# Patient Record
Sex: Male | Born: 1970 | Race: Black or African American | Hispanic: No | Marital: Married | Smoking: Never smoker
Health system: Southern US, Community
[De-identification: ages and names within clinical notes are randomized; demographics above are authoritative.]

## PROBLEM LIST (undated history)

## (undated) DIAGNOSIS — I10 Essential (primary) hypertension: Secondary | ICD-10-CM

## (undated) DIAGNOSIS — F419 Anxiety disorder, unspecified: Secondary | ICD-10-CM

## (undated) DIAGNOSIS — U071 COVID-19: Secondary | ICD-10-CM

## (undated) DIAGNOSIS — T7840XA Allergy, unspecified, initial encounter: Secondary | ICD-10-CM

## (undated) DIAGNOSIS — E785 Hyperlipidemia, unspecified: Secondary | ICD-10-CM

## (undated) DIAGNOSIS — G43909 Migraine, unspecified, not intractable, without status migrainosus: Secondary | ICD-10-CM

## (undated) DIAGNOSIS — J45909 Unspecified asthma, uncomplicated: Secondary | ICD-10-CM

## (undated) DIAGNOSIS — K219 Gastro-esophageal reflux disease without esophagitis: Secondary | ICD-10-CM

## (undated) HISTORY — DX: COVID-19: U07.1

## (undated) HISTORY — DX: Gastro-esophageal reflux disease without esophagitis: K21.9

## (undated) HISTORY — DX: Migraine, unspecified, not intractable, without status migrainosus: G43.909

## (undated) HISTORY — PX: COLONOSCOPY: SHX174

## (undated) HISTORY — DX: Essential (primary) hypertension: I10

## (undated) HISTORY — DX: Anxiety disorder, unspecified: F41.9

## (undated) HISTORY — PX: TENDON REPAIR: SHX5111

## (undated) HISTORY — PX: COSMETIC SURGERY: SHX468

## (undated) HISTORY — DX: Allergy, unspecified, initial encounter: T78.40XA

## (undated) HISTORY — DX: Hyperlipidemia, unspecified: E78.5

---

## 2012-09-16 ENCOUNTER — Encounter (HOSPITAL_COMMUNITY): Payer: Self-pay | Admitting: *Deleted

## 2012-09-16 ENCOUNTER — Observation Stay (HOSPITAL_COMMUNITY)
Admission: EM | Admit: 2012-09-16 | Discharge: 2012-09-17 | DRG: 175 | Disposition: A | Discharge status: Home / Self Care | Payer: BC Managed Care – PPO | Attending: Internal Medicine | Admitting: Internal Medicine

## 2012-09-16 ENCOUNTER — Emergency Department (HOSPITAL_COMMUNITY): Payer: BC Managed Care – PPO

## 2012-09-16 ENCOUNTER — Encounter (HOSPITAL_COMMUNITY)
Admission: EM | Disposition: A | Discharge status: Home / Self Care | Payer: Self-pay | Source: Home / Self Care | Attending: Internal Medicine

## 2012-09-16 DIAGNOSIS — K922 Gastrointestinal hemorrhage, unspecified: Secondary | ICD-10-CM

## 2012-09-16 DIAGNOSIS — I959 Hypotension, unspecified: Secondary | ICD-10-CM | POA: Insufficient documentation

## 2012-09-16 DIAGNOSIS — K920 Hematemesis: Secondary | ICD-10-CM

## 2012-09-16 DIAGNOSIS — G43909 Migraine, unspecified, not intractable, without status migrainosus: Secondary | ICD-10-CM | POA: Insufficient documentation

## 2012-09-16 DIAGNOSIS — K2980 Duodenitis without bleeding: Secondary | ICD-10-CM | POA: Insufficient documentation

## 2012-09-16 DIAGNOSIS — K226 Gastro-esophageal laceration-hemorrhage syndrome: Principal | ICD-10-CM | POA: Insufficient documentation

## 2012-09-16 HISTORY — DX: Unspecified asthma, uncomplicated: J45.909

## 2012-09-16 HISTORY — PX: ESOPHAGOGASTRODUODENOSCOPY: SHX5428

## 2012-09-16 LAB — BASIC METABOLIC PANEL
BUN: 16 mg/dL (ref 6–23)
Calcium: 8.3 mg/dL — ABNORMAL LOW (ref 8.4–10.5)
Creatinine, Ser: 1.01 mg/dL (ref 0.50–1.35)
GFR calc Af Amer: >90 mL/min (ref >90)
GFR calc non Af Amer: >90 mL/min (ref >90)
Glucose, Bld: 108 mg/dL — ABNORMAL HIGH (ref 70–99)
Potassium: 3.9 mEq/L (ref 3.5–5.1)

## 2012-09-16 LAB — CBC
HCT: 37.9 % — ABNORMAL LOW (ref 39.0–52.0)
MCH: 27.1 pg (ref 26.0–34.0)
MCV: 84.2 fL (ref 78.0–100.0)
RDW: 14.6 % (ref 11.5–15.5)
WBC: 7.5 10*3/uL (ref 4.0–10.5)

## 2012-09-16 LAB — CBC WITH DIFFERENTIAL/PLATELET
Basophils Relative: 0 % (ref 0–1)
Eosinophils Absolute: 0 10*3/uL (ref 0.0–0.7)
Eosinophils Absolute: 0 10*3/uL (ref 0.0–0.7)
Eosinophils Relative: 0 % (ref 0–5)
Eosinophils Relative: 1 % (ref 0–5)
Hemoglobin: 11.6 g/dL — ABNORMAL LOW (ref 13.0–17.0)
Lymphs Abs: 1.5 10*3/uL (ref 0.7–4.0)
Lymphs Abs: 1.4 10*3/uL (ref 0.7–4.0)
MCH: 27.3 pg (ref 26.0–34.0)
MCH: 26.9 pg (ref 26.0–34.0)
MCHC: 32.2 g/dL (ref 30.0–36.0)
MCV: 83.1 fL (ref 78.0–100.0)
MCV: 83.5 fL (ref 78.0–100.0)
Monocytes Relative: 7 % (ref 3–12)
Monocytes Relative: 7 % (ref 3–12)
Neutrophils Relative %: 69 % (ref 43–77)
Platelets: 272 10*3/uL (ref 150–400)
RBC: 5.02 MIL/uL (ref 4.22–5.81)
RBC: 4.31 MIL/uL (ref 4.22–5.81)

## 2012-09-16 LAB — COMPREHENSIVE METABOLIC PANEL
BUN: 19 mg/dL (ref 6–23)
Calcium: 9.2 mg/dL (ref 8.4–10.5)
Creatinine, Ser: 1.22 mg/dL (ref 0.50–1.35)
GFR calc Af Amer: 85 mL/min — ABNORMAL LOW (ref >90)
Glucose, Bld: 125 mg/dL — ABNORMAL HIGH (ref 70–99)
Sodium: 134 mEq/L — ABNORMAL LOW (ref 135–145)
Total Protein: 7.4 g/dL (ref 6.0–8.3)

## 2012-09-16 LAB — SAMPLE TO BLOOD BANK

## 2012-09-16 LAB — LACTIC ACID, PLASMA: Lactic Acid, Venous: 2.2 mmol/L (ref 0.5–2.2)

## 2012-09-16 LAB — PROTIME-INR
INR: 1.09 (ref 0.00–1.49)
Prothrombin Time: 14 seconds (ref 11.6–15.2)

## 2012-09-16 SURGERY — EGD (ESOPHAGOGASTRODUODENOSCOPY)
Anesthesia: Moderate Sedation

## 2012-09-16 MED ORDER — PANTOPRAZOLE SODIUM 40 MG IV SOLR
8.0000 mg/h | INTRAVENOUS | Status: DC
  Administered 2012-09-16 (×3): 8 mg/h via INTRAVENOUS
  Filled 2012-09-16 (×9): qty 80

## 2012-09-16 MED ORDER — ALBUTEROL SULFATE (5 MG/ML) 0.5% IN NEBU: 2.5000 mg | INHALATION_SOLUTION | RESPIRATORY_TRACT | Status: DC | PRN

## 2012-09-16 MED ORDER — SODIUM CHLORIDE 0.9 % IV BOLUS (SEPSIS)
2000.0000 mL | Freq: Once | INTRAVENOUS | Status: AC
  Administered 2012-09-16: 2000 mL via INTRAVENOUS

## 2012-09-16 MED ORDER — MIDAZOLAM HCL 5 MG/ML IJ SOLN
INTRAMUSCULAR | Status: AC
  Filled 2012-09-16: qty 2

## 2012-09-16 MED ORDER — BUTAMBEN-TETRACAINE-BENZOCAINE 2-2-14 % EX AERO
INHALATION_SPRAY | CUTANEOUS | Status: DC | PRN
  Administered 2012-09-16: 2 via TOPICAL

## 2012-09-16 MED ORDER — ONDANSETRON HCL 4 MG PO TABS: 4.0000 mg | ORAL_TABLET | Freq: Four times a day (QID) | ORAL | Status: DC | PRN

## 2012-09-16 MED ORDER — TOPIRAMATE 25 MG PO TABS
50.0000 mg | ORAL_TABLET | Freq: Every day | ORAL | Status: DC | PRN
  Filled 2012-09-16: qty 2

## 2012-09-16 MED ORDER — ONDANSETRON HCL 4 MG/2ML IJ SOLN
4.0000 mg | Freq: Four times a day (QID) | INTRAMUSCULAR | Status: DC
  Administered 2012-09-16 – 2012-09-17 (×6): 4 mg via INTRAVENOUS
  Filled 2012-09-16 (×6): qty 2

## 2012-09-16 MED ORDER — SODIUM CHLORIDE 0.9 % IV SOLN
80.0000 mg | Freq: Once | INTRAVENOUS | Status: AC
  Administered 2012-09-16: 80 mg via INTRAVENOUS
  Filled 2012-09-16: qty 80

## 2012-09-16 MED ORDER — GUAIFENESIN-DM 100-10 MG/5ML PO SYRP: 5.0000 mL | ORAL_SOLUTION | ORAL | Status: DC | PRN

## 2012-09-16 MED ORDER — SODIUM CHLORIDE 0.9 % IV SOLN
1000.0000 mL | Freq: Once | INTRAVENOUS | Status: AC
  Administered 2012-09-16: 1000 mL via INTRAVENOUS

## 2012-09-16 MED ORDER — HYDROCODONE-ACETAMINOPHEN 5-325 MG PO TABS: 1.0000 | ORAL_TABLET | ORAL | Status: DC | PRN

## 2012-09-16 MED ORDER — SUMATRIPTAN SUCCINATE 100 MG PO TABS
100.0000 mg | ORAL_TABLET | ORAL | Status: DC | PRN
  Filled 2012-09-16: qty 1

## 2012-09-16 MED ORDER — FENTANYL CITRATE 0.05 MG/ML IJ SOLN
INTRAMUSCULAR | Status: DC | PRN
  Administered 2012-09-16 (×3): 25 ug via INTRAVENOUS

## 2012-09-16 MED ORDER — MIDAZOLAM HCL 10 MG/2ML IJ SOLN
INTRAMUSCULAR | Status: DC | PRN
  Administered 2012-09-16: 2 mg via INTRAVENOUS
  Administered 2012-09-16: 1 mg via INTRAVENOUS
  Administered 2012-09-16: 2 mg via INTRAVENOUS

## 2012-09-16 MED ORDER — ACETAMINOPHEN 650 MG RE SUPP: 650.0000 mg | Freq: Four times a day (QID) | RECTAL | Status: DC | PRN

## 2012-09-16 MED ORDER — SODIUM CHLORIDE 0.9 % IV SOLN
1000.0000 mL | INTRAVENOUS | Status: DC
  Administered 2012-09-16: 1000 mL via INTRAVENOUS

## 2012-09-16 MED ORDER — MOMETASONE FURO-FORMOTEROL FUM 100-5 MCG/ACT IN AERO
2.0000 | INHALATION_SPRAY | Freq: Two times a day (BID) | RESPIRATORY_TRACT | Status: DC
  Administered 2012-09-16 – 2012-09-17 (×3): 2 via RESPIRATORY_TRACT
  Filled 2012-09-16: qty 8.8

## 2012-09-16 MED ORDER — ACETAMINOPHEN 325 MG PO TABS: 650.0000 mg | ORAL_TABLET | Freq: Four times a day (QID) | ORAL | Status: DC | PRN

## 2012-09-16 MED ORDER — ONDANSETRON HCL 4 MG/2ML IJ SOLN: 4.0000 mg | Freq: Four times a day (QID) | INTRAMUSCULAR | Status: DC | PRN

## 2012-09-16 MED ORDER — SODIUM CHLORIDE 0.9 % IV SOLN: INTRAVENOUS | Status: AC

## 2012-09-16 MED ORDER — SODIUM CHLORIDE 0.9 % IJ SOLN
3.0000 mL | Freq: Two times a day (BID) | INTRAMUSCULAR | Status: DC
  Administered 2012-09-16: 3 mL via INTRAVENOUS

## 2012-09-16 MED ORDER — DIPHENHYDRAMINE HCL 50 MG/ML IJ SOLN
INTRAMUSCULAR | Status: AC
  Filled 2012-09-16: qty 1

## 2012-09-16 MED ORDER — ALBUTEROL SULFATE HFA 108 (90 BASE) MCG/ACT IN AERS
1.0000 | INHALATION_SPRAY | Freq: Two times a day (BID) | RESPIRATORY_TRACT | Status: DC
  Administered 2012-09-16 – 2012-09-17 (×4): 1 via RESPIRATORY_TRACT
  Filled 2012-09-16: qty 6.7

## 2012-09-16 MED ORDER — FENTANYL CITRATE 0.05 MG/ML IJ SOLN
INTRAMUSCULAR | Status: AC
  Filled 2012-09-16: qty 2

## 2012-09-16 MED ORDER — SODIUM CHLORIDE 0.9 % IV SOLN
50.0000 ug/h | INTRAVENOUS | Status: DC
  Administered 2012-09-16: 50 ug/h via INTRAVENOUS
  Filled 2012-09-16 (×3): qty 1

## 2012-09-16 NOTE — ED Notes (Signed)
Pt coming from home with c/o hematemesis, hypotention, tachycardia. Wife states blood was bright red blood. Pt reports uncontrollable diarrhea. Upon arrival pt was warm cool. En route pt became diaphoretic and cool. Pt given 250 mL of NS. 18 g LAC.

## 2012-09-16 NOTE — Progress Notes (Signed)
Pt chart reviewed and pt is doing well.

## 2012-09-16 NOTE — Consult Note (Signed)
Referring Provider: [facility] Primary Care Physician:  No primary provider on file. Primary Gastroenterologist:  None, unassigned for Drs. Mann/Hung  Reason for Consultation:  Hematemesis  HPI: Calvin Clay is a 41 y.o. male generally in good health with history of migraine headaches and asthma. Patient came to the emergency room last evening and was admitted. He had acute onset yesterday morning about 6 AM with upper abdominal pain which did not radiate and nausea. He said within a couple of hours he had a violent episode of vomiting vomited up a lot of material in it then saw streaks of bright red blood. He says rest of the day he did not feel well was tired fatigued and stayed in bed. He continued to have upper abdominal discomfort, he was able to drink water and had some bread. Last evening about 10:30 after he had gone to bed he became acutely nauseated again and had another episode of violent vomiting and says that time he vomited up a lot of bright red blood and dark blood. His wife took a picture, and showed this to me and indeed it looks like a commode full blood. With that episode he had diaphoresis and dizziness and an episode of watery diarrhea which was nonbloody. His wife called EMS and he was transported to the hospital, initially had a blood pressure the 80s to 90s systolic which improved with volume replacement. Since admission he has not had any further vomiting, hematemesis or diarrhea. He feels better this morning though still complaining of upper abdominal discomfort. His blood pressure is stabilized in the low 100s and his pulse is normal. He has no prior GI history him a does have periodic heartburn. He admits to taking over-the-counter migraine medication which contains aspirin and caffeine and says that she usually takes a few doses 2 or 3 times per week He has no history of ETOH abuse. On further questioning, patient has a 12-year-old son who was  sick earlier in the week  with nausea and diarrhea which resolved within 24 hours.   Past Medical History  Diagnosis Date  . Asthma     Past Surgical History  Procedure Date  . Cosmetic surgery     Prior to Admission medications   Medication Sig Start Date End Date Taking? Authorizing Provider  albuterol (PROVENTIL HFA;VENTOLIN HFA) 108 (90 BASE) MCG/ACT inhaler Inhale 1 puff into the lungs 2 (two) times daily.   Yes Historical Provider, MD  aspirin-acetaminophen-caffeine (EXCEDRIN MIGRAINE) [phone] MG per tablet Take 1 tablet by mouth every 6 (six) hours as needed. For headache   Yes Historical Provider, MD  calcium carbonate (TUMS - DOSED IN MG ELEMENTAL CALCIUM) 500 MG chewable tablet Chew 1 tablet by mouth daily.   Yes Historical Provider, MD  mometasone-formoterol (DULERA) 100-5 MCG/ACT AERO Inhale 2 puffs into the lungs 2 (two) times daily.   Yes Historical Provider, MD  SUMAtriptan (IMITREX) 100 MG tablet Take 100 mg by mouth every 2 (two) hours as needed. For headache   Yes Historical Provider, MD  topiramate (TOPAMAX) 50 MG tablet Take 50 mg by mouth daily as needed. For headaches   Yes Historical Provider, MD    Current Facility-Administered Medications  Medication Dose Route Frequency Provider Last Rate Last Dose  . 0.9 %  sodium chloride infusion  1,000 mL Intravenous Once Olivia Mackie, MD   1,000 mL at 09/16/12 0050   Followed by  . 0.9 %  sodium chloride infusion  1,000 mL Intravenous Once  Olivia Mackie, MD   1,000 mL at 09/16/12 0050  . 0.9 %  sodium chloride infusion   Intravenous STAT Olivia Mackie, MD 150 mL/hr at 09/16/12 1000    . acetaminophen (TYLENOL) tablet 650 mg  650 mg Oral Q6H PRN Therisa Doyne, MD       Or  . acetaminophen (TYLENOL) suppository 650 mg  650 mg Rectal Q6H PRN Therisa Doyne, MD      . albuterol (PROVENTIL HFA;VENTOLIN HFA) 108 (90 BASE) MCG/ACT inhaler 1 puff  1 puff Inhalation BID Anastassia Doutova, MD      . albuterol (PROVENTIL) (5 MG/ML) 0.5%  nebulizer solution 2.5 mg  2.5 mg Nebulization Q2H PRN Therisa Doyne, MD      . guaiFENesin-dextromethorphan (ROBITUSSIN DM) 100-10 MG/5ML syrup 5 mL  5 mL Oral Q4H PRN Therisa Doyne, MD      . HYDROcodone-acetaminophen (NORCO/VICODIN) 5-325 MG per tablet 1-2 tablet  1-2 tablet Oral Q4H PRN Therisa Doyne, MD      . mometasone-formoterol (DULERA) 100-5 MCG/ACT inhaler 2 puff  2 puff Inhalation BID Therisa Doyne, MD      . ondansetron (ZOFRAN) injection 4 mg  4 mg Intravenous Q6H Olivia Mackie, MD   4 mg at 09/16/12 0101  . ondansetron (ZOFRAN) tablet 4 mg  4 mg Oral Q6H PRN Therisa Doyne, MD       Or  . ondansetron (ZOFRAN) injection 4 mg  4 mg Intravenous Q6H PRN Therisa Doyne, MD      . pantoprazole (PROTONIX) 80 mg in sodium chloride 0.9 % 100 mL IVPB  80 mg Intravenous Once Olivia Mackie, MD   80 mg at 09/16/12 0147  . pantoprazole (PROTONIX) 80 mg in sodium chloride 0.9 % 250 mL infusion  8 mg/hr Intravenous Continuous Olivia Mackie, MD 25 mL/hr at 09/16/12 0213 8 mg/hr at 09/16/12 0213  . sodium chloride 0.9 % bolus 2,000 mL  2,000 mL Intravenous Once Olivia Mackie, MD   2,000 mL at 09/16/12 0523  . sodium chloride 0.9 % injection 3 mL  3 mL Intravenous Q12H Therisa Doyne, MD      . SUMAtriptan (IMITREX) tablet 100 mg  100 mg Oral Q2H PRN Therisa Doyne, MD      . topiramate (TOPAMAX) tablet 50 mg  50 mg Oral Daily PRN Therisa Doyne, MD      . DISCONTD: 0.9 %  sodium chloride infusion  1,000 mL Intravenous Continuous Olivia Mackie, MD   1,000 mL at 09/16/12 0153  . DISCONTD: octreotide (SANDOSTATIN) 2 mcg/mL in sodium chloride 0.9 % 250 mL infusion  50 mcg/hr Intravenous Continuous Olivia Mackie, MD   50 mcg/hr at 09/16/12 0151    Allergies as of 09/16/2012 - Review Complete 09/16/2012  Allergen Reaction Noted  . Iodine Anaphylaxis 09/16/2012    Family History  Problem Relation Age of Onset  . Colon cancer Mother   . Colon cancer Father      History   Social History  . Marital Status: Married    Spouse Name: N/A    Number of Children: N/A  . Years of Education: N/A   Occupational History  . Not on file.   Social History Main Topics  . Smoking status: Former Games developer  . Smokeless tobacco: Not on file  . Alcohol Use: Yes     occasional  . Drug Use: No  . Sexually Active:    Other Topics Concern  . Not on file  Social History Narrative  . No narrative on file    Review of Systems: Pertinent positive and negative review of systems were noted in the above HPI section.  All other review of systems was otherwise negative.  Physical Exam: Vital signs in last 24 hours: Temp:  [97.9 F (36.6 C)-99 F (37.2 C)] 98.1 F (36.7 C) (10/12 0905) Pulse Rate:  [79-102] 88  (10/12 1000) Resp:  [6-20] 19  (10/12 1000) BP: (83-119)/(50-73) 113/73 mmHg (10/12 1000) SpO2:  [94 %-100 %] 99 % (10/12 1000) Weight:  [312 lb 9.8 oz (141.8 kg)] 312 lb 9.8 oz (141.8 kg) (10/12 1610) Last BM Date: 09/14/12 General:   Alert, well-developed, well-nourished, AA male, pleasant and cooperative in NAD Head:  Normocephalic and atraumatic. Eyes:  Sclera clear, no icterus.   Conjunctiva pink. Ears:  Normal auditory acuity. Nose:  No deformity, discharge,  or lesions. Mouth:  No deformity or lesions.   Neck:  Supple; no masses or thyromegaly. Lungs:  Clear throughout to auscultation.   No wheezes, crackles, or rhonchi.  Heart:  Regular rate and rhythm; no murmurs, clicks, rubs,  or gallops. Abdomen:  Soft,tenderupper abdomen, no guarding, no rebound, no mass or hsm ,bs+   Rectal:  Deferred  Msk:  Symmetrical without gross deformities. . Pulses:  Normal pulses noted. Extremities:  Without clubbing or edema. Neurologic:  Alert and  oriented x4;  grossly normal neurologically. Skin:  Intact without significant lesions or rashes. Psych:  Alert and cooperative. Normal mood and affect.  Intake/Output from previous day: 10/11 0701 -  10/12 0700 In: 25 [I.V.:25] Out: 500 [Urine:500] Intake/Output this shift: Total I/O In: 75 [I.V.:75] Out: 0   Lab Results:  [facility] 09/16/12 0042  WBC 12.5*  HGB 13.7  HCT 41.7  PLT 272   BMET  Basename 09/16/12 0042  NA 134*  K 3.8  CL 100  CO2 21  GLUCOSE 125*  BUN 19  CREATININE 1.22  CALCIUM 9.2   LFT  Basename 09/16/12 0042  PROT 7.4  ALBUMIN 3.3*  AST 29  ALT 37  ALKPHOS 82  BILITOT 0.6  BILIDIR --  IBILI --   PT/INR  Basename 09/16/12 0042  LABPROT 14.0  INR 1.09   Studies/Results: Dg Abd Acute W/chest  09/16/2012  *RADIOLOGY REPORT*  Clinical Data: Nausea/vomiting/diarrhea, hypertension, hematemesis  ACUTE ABDOMEN SERIES (ABDOMEN 2 VIEW & CHEST 1 VIEW)  Comparison: None.  Findings: Lungs are clear. No pleural effusion or pneumothorax.  Cardiomediastinal silhouette is within normal limits.  Nonobstructive bowel gas pattern.  No evidence of free air under the diaphragm on the upright view.  Visualized osseous structures are within normal limits.  9 mm calcification overlying the right lower pelvis.  IMPRESSION: No evidence of acute cardiopulmonary disease.  No evidence of small bowel obstruction or free air.  9 mm calcification overlying the right lower pelvis, nonspecific, ureteral/bladder calculus not excluded.   Original Report Authenticated By: Charline Bills, M.D.     IMPRESSION:  #67 41 year old male with acute hematemesis of bright red blood occurring after episodes of forceful vomiting. Rule out a Mallory-Weiss tear versus ulcer disease. #2 upper abdominal pain nausea and diarrhea, acute onset within the past 24 hours-suspect this may be a gastroenteritis complicated by above #3 hypotension-resolved #4 history of migraines  PLAN: #1 will schedule for upper endoscopy this morning with Dr. Russella Dar, procedure was discussed in detail with the patient and his wife and they are agreeable to proceed #2 keep n.p.o. until after  endoscopy #3 stat CBC  and BMET #4 serial hemoglobins #5 IV Protonix every 12 hours, and anti-emetics as needed #6 further plans pending results of the EGD  Amy Esterwood  09/16/2012, 10:20 AM   I have taken a history, examined the patient and reviewed the chart. I agree with the extender's note, impression and recommendations. Hematemesis following repeated vomiting. Suspect a Mallory Weiss tear. R/O ulcer and other cause. Suspect a gastroenteritis lead to N/V. EGD today.  Meryl Dare MD [facility]

## 2012-09-16 NOTE — H&P (Signed)
PCP:  [facility]   Chief Complaint:  hematemesis  HPI: Calvin Clay is a 41 y.o. male   has a past medical history of Asthma.   Presented with  24 hours of Nausea vomiting diarrhea an severe abdominal pain. His first vomited  just food. At 11 pm he had a bloody emesis. Denies any fevers or chills he overall feels very lethargic and tired. He reports severe prolonged periods of emesis and straining. He had been having periumbilical pain. No sick contacts at home.  Review of Systems:    Pertinent positives include:abdominal pain, nausea, vomiting, diarrhea,  hematemesis  Constitutional:  No weight loss, night sweats, Fevers, chills, fatigue, weight loss  HEENT:  No headaches, Difficulty swallowing,Tooth/dental problems,Sore throat,  No sneezing, itching, ear ache, nasal congestion, post nasal drip,  Cardio-vascular:  No chest pain, Orthopnea, PND, anasarca, dizziness, palpitations.no Bilateral lower extremity swelling  GI:  No heartburn, indigestion, change in bowel habits, loss of appetite, melena, blood in stool, Resp:  no shortness of breath at rest. No dyspnea on exertion, No excess mucus, no productive cough, No non-productive cough, No coughing up of blood.No change in color of mucus.No wheezing. Skin:  no rash or lesions. No jaundice GU:  no dysuria, change in color of urine, no urgency or frequency. No straining to urinate.  No flank pain.  Musculoskeletal:  No joint pain or no joint swelling. No decreased range of motion. No back pain.  Psych:  No change in mood or affect. No depression or anxiety. No memory loss.  Neuro: no localizing neurological complaints, no tingling, no weakness, no double vision, no gait abnormality, no slurred speech, no confusion  Otherwise ROS are negative except for above, 10 systems were reviewed  Past Medical History: Past Medical History  Diagnosis Date  . Asthma    Past Surgical History  Procedure Date  .  Cosmetic surgery      Medications: Prior to Admission medications   Medication Sig Start Date End Date Taking? Authorizing Provider  albuterol (PROVENTIL HFA;VENTOLIN HFA) 108 (90 BASE) MCG/ACT inhaler Inhale 1 puff into the lungs 2 (two) times daily.   Yes Historical Provider, MD  aspirin-acetaminophen-caffeine (EXCEDRIN MIGRAINE) [phone] MG per tablet Take 1 tablet by mouth every 6 (six) hours as needed. For headache   Yes Historical Provider, MD  calcium carbonate (TUMS - DOSED IN MG ELEMENTAL CALCIUM) 500 MG chewable tablet Chew 1 tablet by mouth daily.   Yes Historical Provider, MD  mometasone-formoterol (DULERA) 100-5 MCG/ACT AERO Inhale 2 puffs into the lungs 2 (two) times daily.   Yes Historical Provider, MD  SUMAtriptan (IMITREX) 100 MG tablet Take 100 mg by mouth every 2 (two) hours as needed. For headache   Yes Historical Provider, MD  topiramate (TOPAMAX) 50 MG tablet Take 50 mg by mouth daily as needed. For headaches   Yes Historical Provider, MD    Allergies:   Allergies  Allergen Reactions  . Iodine Anaphylaxis    Social History:  Ambulatory   Independently  Lives at  home   reports that he has quit smoking. He does not have any smokeless tobacco history on file. He reports that he drinks alcohol. He reports that he does not use illicit drugs.   Family History: family history includes Colon cancer in his father and mother.    Physical Exam: Patient Vitals for the past 24 hrs:  BP Temp Temp src Pulse Resp SpO2  09/16/12 0430 104/61 mmHg - - 88  12  94 %  09/16/12 0330 108/63 mmHg - - 88  11  94 %  09/16/12 0315 105/73 mmHg - - 90  12  100 %  09/16/12 0300 104/68 mmHg - - 87  14  94 %  09/16/12 0245 105/73 mmHg - - 87  - 99 %  09/16/12 0200 100/57 mmHg - - 92  16  95 %  09/16/12 0130 108/50 mmHg - - 98  14  99 %  09/16/12 0115 109/50 mmHg - - 100  20  98 %  09/16/12 0100 94/50 mmHg - - 98  18  97 %  09/16/12 0034 83/66 mmHg 98.1 F (36.7 C) Oral 102  16   96 %    1. General:  in No Acute distress 2. Psychological: Alert and Oriented 3. Head/ENT:    Dry Mucous Membranes                          Head Non traumatic, neck supple                          Normal  Dentition 4. SKIN:  decreased Skin turgor,  Skin clean Dry and intact no rash 5. Heart: Regular rate and rhythm no Murmur, Rub or gallop 6. Lungs: Clear to auscultation bilaterally, no wheezes or crackles   7. Abdomen: Soft, slightly diffusely tender, Non distended 8. Lower extremities: no clubbing, cyanosis, or edema 9. Neurologically Grossly intact, moving all 4 extremities equally 10. MSK: Normal range of motion  body mass index is unknown because there is no height or weight on file.   Labs on Admission:   [facility] 09/16/12 0042  NA 134*  K 3.8  CL 100  CO2 21  GLUCOSE 125*  BUN 19  CREATININE 1.22  CALCIUM 9.2  MG --  PHOS --    Basename 09/16/12 0042  AST 29  ALT 37  ALKPHOS 82  BILITOT 0.6  PROT 7.4  ALBUMIN 3.3*   No results found for this basename: LIPASE:2,AMYLASE:2 in the last 72 hours  Basename 09/16/12 0042  WBC 12.5*  NEUTROABS 10.1*  HGB 13.7  HCT 41.7  MCV 83.1  PLT 272   No results found for this basename: CKTOTAL:3,CKMB:3,CKMBINDEX:3,TROPONINI:3 in the last 72 hours No results found for this basename: TSH,T4TOTAL,FREET3,T3FREE,THYROIDAB in the last 72 hours No results found for this basename: VITAMINB12:2,FOLATE:2,FERRITIN:2,TIBC:2,IRON:2,RETICCTPCT:2 in the last 72 hours No results found for this basename: HGBA1C    CrCl is unknown because there is no height on file for the current visit. ABG No results found for this basename: phart, pco2, po2, hco3, tco2, acidbasedef, o2sat     No results found for this basename: DDIMER         Radiological Exams on Admission: Dg Abd Acute W/chest  09/16/2012  *RADIOLOGY REPORT*  Clinical Data: Nausea/vomiting/diarrhea, hypertension, hematemesis  ACUTE ABDOMEN SERIES (ABDOMEN 2 VIEW & CHEST  1 VIEW)  Comparison: None.  Findings: Lungs are clear. No pleural effusion or pneumothorax.  Cardiomediastinal silhouette is within normal limits.  Nonobstructive bowel gas pattern.  No evidence of free air under the diaphragm on the upright view.  Visualized osseous structures are within normal limits.  9 mm calcification overlying the right lower pelvis.  IMPRESSION: No evidence of acute cardiopulmonary disease.  No evidence of small bowel obstruction or free air.  9 mm calcification overlying the right lower pelvis, nonspecific, ureteral/bladder calculus not  excluded.   Original Report Authenticated By: Charline Bills, M.D.     Chart has been reviewed  Assessment/Plan  53 y o with hematemesis and possible gastroenteritis and dehydration  Present on Admission:  .GI bleed - GI is aware and will come and see him in a.m. meanwhile we'll continue on Protonix drip and aggressive IV fluid rehydration we'll admit to step down repeat serial CBCs. Make n.p.o.  .Hematemesis - I suspect Mallory-Weiss tear will need endoscopy to confirm or make sure he is on Protonix  .Hypotension - responding to IV fluids will continue and will watch and step down  .Migraine - continue home medications   Prophylaxis: SCD Protonix  CODE STATUS: FULL CODE   Other plan as per orders.  I have spent a total of 55 min on this admission  Calvin Clay 09/16/2012, 5:05 AM

## 2012-09-16 NOTE — Op Note (Signed)
[facility] [address] [place], [address]   ENDOSCOPY PROCEDURE REPORT  PATIENT: Calvin Clay, Calvin Clay  MR#: [id number] BIRTHDATE: 17-Oct-1971 , 41  yrs. old GENDER: Male ENDOSCOPIST: Meryl Dare, MD, [facility] REFERRED BY:  [facility] PROCEDURE DATE:  09/16/2012 PROCEDURE:  EGD, diagnostic ASA CLASS:     Class II INDICATIONS:  hematemesis,  vomiting, epigastric pain MEDICATIONS: medications were titrated to patient response per physician's verbal order, Versed 5 mg IV, and Fentanyl 75 mcg IV TOPICAL ANESTHETIC: Cetacaine Spray DESCRIPTION OF PROCEDURE: After the risks benefits and alternatives of the procedure were thoroughly explained, informed consent was obtained.  The Pentax Gastroscope [id number] endoscope was introduced through the mouth and advanced to the second portion of the duodenum. Limited by retained old blood primarily in the fundus but scattered throughout the stomach.  The instrument was slowly withdrawn as the mucosa was fully examined.   ESOPHAGUS: A Mallory-Weiss tear with evidence of recent bleeding was found at the gastroesophageal junction. A small clot on the tear. No active bleeding noted.  The esophagus was otherwise normal. STOMACH: The mucosa of the stomach appeared normal however the fundus was not fully visualized with retained old blood. DUODENUM: Mild duodenal inflammation was found in the duodenal bulb. The duodenal mucosa showed no abnormalities in the 2nd part of the duodenum. Retroflexed views revealed no abnormalities.  The scope was then withdrawn from the patient and the procedure completed.  COMPLICATIONS: There were no complications.  ENDOSCOPIC IMPRESSION: 1.   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   Duodenitis, erosive, mild  RECOMMENDATIONS: 1. Continue PPI for 6 weeks 2. Serum H. pylori Ab 3. Clear liquids and advance diet as tolerated    eSigned:  Meryl Dare, MD, [facility]  09/16/2012 12:57 PM

## 2012-09-16 NOTE — ED Provider Notes (Signed)
History     CSN: [id number]  Arrival date & time 09/16/12  0028   First MD Initiated Contact with Patient 09/16/12 0030      Chief Complaint  Patient presents with  . Hematemesis  . Hypotension  . Abdominal Pain    (Consider location/radiation/quality/duration/timing/severity/associated sxs/prior treatment) HPI 41 yo male presents from home with reported vomiting blood, diarrhea, and low blood pressure.  Pt reports waking yesterday morning (friday) around 6 am with abdominal pain and nausea.  He had a "violent, severe" episode of emesis with mainly food from prior night's dinner mixed with a few streaks of blood.  Pt laid in bed most of the day with abdominal cramping and malaise.  Pt tolerated water during the day.  In the afternoon he ate a few pieces of bread.  This evening he had another episode of vomiting, this time bright red blood followed by large amount of watery stools.  No blood or black appearance to stools.  He denies NSAID use, no heavy alcohol intake, no h/o liver, stomach or esophagus problems aside from GERD tx with nexium in the past.  No travel, no sick contacts.  No prior h/o same.  EMS reports pt cool,diaphoretic clammy and hypotensive in route, bps 80-90 systolic.  Past Medical History  Diagnosis Date  . Asthma     Past Surgical History  Procedure Date  . Cosmetic surgery     Family History  Problem Relation Age of Onset  . Colon cancer Mother   . Colon cancer Father     History  Substance Use Topics  . Smoking status: Former Games developer  . Smokeless tobacco: Not on file  . Alcohol Use: Yes     occasional      Review of Systems  All other systems reviewed and are negative.    Allergies  Iodine  Home Medications   Current Outpatient Rx  Name Route Sig Dispense Refill  . ALBUTEROL SULFATE HFA 108 (90 BASE) MCG/ACT IN AERS Inhalation Inhale 1 puff into the lungs 2 (two) times daily.    . ASPIRIN-ACETAMINOPHEN-CAFFEINE 250-250-65 MG PO TABS  Oral Take 1 tablet by mouth every 6 (six) hours as needed. For headache    . CALCIUM CARBONATE ANTACID 500 MG PO CHEW Oral Chew 1 tablet by mouth daily.    . MOMETASONE FURO-FORMOTEROL FUM 100-5 MCG/ACT IN AERO Inhalation Inhale 2 puffs into the lungs 2 (two) times daily.    . SUMATRIPTAN SUCCINATE 100 MG PO TABS Oral Take 100 mg by mouth every 2 (two) hours as needed. For headache    . TOPIRAMATE 50 MG PO TABS Oral Take 50 mg by mouth daily as needed. For headaches      BP 104/61  Pulse 88  Temp 98.1 F (36.7 C) (Oral)  Resp 12  SpO2 94%  Physical Exam  Nursing note and vitals reviewed. Constitutional: He is oriented to person, place, and time. He appears well-developed and well-nourished.  HENT:  Head: Normocephalic and atraumatic.  Nose: Nose normal.  Mouth/Throat: Oropharynx is clear and moist.  Eyes: Conjunctivae normal and EOM are normal. Pupils are equal, round, and reactive to light.  Neck: Normal range of motion. Neck supple. No JVD present. No tracheal deviation present. No thyromegaly present.  Cardiovascular: Normal rate, regular rhythm, normal heart sounds and intact distal pulses.  Exam reveals no gallop and no friction rub.   No murmur heard. Pulmonary/Chest: Effort normal and breath sounds normal. No stridor. No respiratory distress. He  has no wheezes. He has no rales. He exhibits no tenderness.  Abdominal: Soft. He exhibits no distension and no mass. There is tenderness (mild epigastric tenderness). There is no rebound and no guarding.       Hyperactive bowel sounds  Musculoskeletal: Normal range of motion. He exhibits no edema and no tenderness.  Lymphadenopathy:    He has no cervical adenopathy.  Neurological: He is oriented to person, place, and time. He exhibits normal muscle tone. Coordination normal.  Skin: Skin is warm. No rash noted. He is diaphoretic. No erythema. No pallor.  Psychiatric: He has a normal mood and affect. His behavior is normal. Judgment and  thought content normal.    ED Course  Procedures (including critical care time)  CRITICAL CARE Performed by: Olivia Mackie   Total critical care time:65 Critical care time was exclusive of separately billable procedures and treating other patients.  Critical care was necessary to treat or prevent imminent or life-threatening deterioration.  Critical care was time spent personally by me on the following activities: development of treatment plan with patient and/or surrogate as well as nursing, discussions with consultants, evaluation of patient's response to treatment, examination of patient, obtaining history from patient or surrogate, ordering and performing treatments and interventions, ordering and review of laboratory studies, ordering and review of radiographic studies, pulse oximetry and re-evaluation of patient's condition.  Labs Reviewed  CBC WITH DIFFERENTIAL - Abnormal; Notable for the following:    WBC 12.5 (*)     Neutrophils Relative 81 (*)     Neutro Abs 10.1 (*)     All other components within normal limits  COMPREHENSIVE METABOLIC PANEL - Abnormal; Notable for the following:    Sodium 134 (*)     Glucose, Bld 125 (*)     Albumin 3.3 (*)     GFR calc non Af Amer 73 (*)     GFR calc Af Amer 85 (*)     All other components within normal limits  LACTIC ACID, PLASMA  PROTIME-INR  SAMPLE TO BLOOD BANK   Dg Abd Acute W/chest  09/16/2012  *RADIOLOGY REPORT*  Clinical Data: Nausea/vomiting/diarrhea, hypertension, hematemesis  ACUTE ABDOMEN SERIES (ABDOMEN 2 VIEW & CHEST 1 VIEW)  Comparison: None.  Findings: Lungs are clear. No pleural effusion or pneumothorax.  Cardiomediastinal silhouette is within normal limits.  Nonobstructive bowel gas pattern.  No evidence of free air under the diaphragm on the upright view.  Visualized osseous structures are within normal limits.  9 mm calcification overlying the right lower pelvis.  IMPRESSION: No evidence of acute cardiopulmonary  disease.  No evidence of small bowel obstruction or free air.  9 mm calcification overlying the right lower pelvis, nonspecific, ureteral/bladder calculus not excluded.   Original Report Authenticated By: Charline Bills, M.D.      1. GI bleed   2. Hematemesis   3. Hypotension       MDM  41 year old male with hematemesis along with diarrhea. Differential includes Mallory-Weiss tear, peptic ulcer disease, esophageal varices, although this is less likely given patient's denial of heavy alcohol use or liver disease. Patient is hypotensive, this may be fatal reaction versus acute volume loss. Patient has 2 large-bore IVs. Plan is for fluid resuscitation, Protonix and octreotide. Patient to have full blood work including old clot for potential blood transfusion. Patient updated on plan.   Discussed case with Dr. Russella Dar on call for gastroenterology. He will have the patient seen in the morning for EGD. Discuss with hospitalist  for admission to step down.        Olivia Mackie, MD 09/16/12 [phone]

## 2012-09-16 NOTE — Interval H&P Note (Signed)
History and Physical Interval Note:  09/16/2012 11:22 AM  Calvin Clay  has presented today for surgery, with the diagnosis of hematemesis  The various methods of treatment have been discussed with the patient and family. After consideration of risks, benefits and other options for treatment, the patient has consented to  Procedure(s) (LRB) with comments: ESOPHAGOGASTRODUODENOSCOPY (EGD) (N/A) as a surgical intervention .  The patient's history has been reviewed, patient examined, no change in status, stable for surgery.  I have reviewed the patient's chart and labs.  Questions were answered to the patient's satisfaction.     Venita Lick. Russella Dar MD [facility] Graham

## 2012-09-17 LAB — CBC
Hemoglobin: 11.3 g/dL — ABNORMAL LOW (ref 13.0–17.0)
MCH: 27.3 pg (ref 26.0–34.0)
MCHC: 32.8 g/dL (ref 30.0–36.0)
MCV: 83.8 fL (ref 78.0–100.0)
Platelets: 236 10*3/uL (ref 150–400)
Platelets: 235 10*3/uL (ref 150–400)
RBC: 4.19 MIL/uL — ABNORMAL LOW (ref 4.22–5.81)
RDW: 14.4 % (ref 11.5–15.5)
WBC: 7.3 10*3/uL (ref 4.0–10.5)

## 2012-09-17 LAB — PHOSPHORUS: Phosphorus: 3 mg/dL (ref 2.3–4.6)

## 2012-09-17 LAB — COMPREHENSIVE METABOLIC PANEL
AST: 21 U/L (ref 0–37)
Albumin: 3 g/dL — ABNORMAL LOW (ref 3.5–5.2)
BUN: 10 mg/dL (ref 6–23)
Calcium: 8.8 mg/dL (ref 8.4–10.5)
Chloride: 106 mEq/L (ref 96–112)
Creatinine, Ser: 0.93 mg/dL (ref 0.50–1.35)
Total Bilirubin: 0.3 mg/dL (ref 0.3–1.2)
Total Protein: 6.3 g/dL (ref 6.0–8.3)

## 2012-09-17 LAB — MAGNESIUM: Magnesium: 2 mg/dL (ref 1.5–2.5)

## 2012-09-17 MED ORDER — PANTOPRAZOLE SODIUM 40 MG PO TBEC: 40.0000 mg | DELAYED_RELEASE_TABLET | Freq: Every day | ORAL | Status: DC

## 2012-09-17 MED ORDER — ONDANSETRON HCL 4 MG PO TABS: 4.0000 mg | ORAL_TABLET | Freq: Four times a day (QID) | ORAL | Status: DC | PRN

## 2012-09-17 NOTE — Discharge Summary (Signed)
Physician Discharge Summary  Calvin Clay NGE:[id number] DOB: 11/21/1971 DOA: 09/16/2012  PCP: No primary provider on file.  Admit date: 09/16/2012 Discharge date: 09/17/2012  Recommendations for Outpatient Follow-up:  1. F/u with PCP as needed  Discharge Diagnoses:  Active Problems:  GI bleed  Hematemesis  Hypotension  Mallory-Weiss tear  Migraine   Discharge Condition: stable  Diet recommendation: very soft x 48 hours and then regular  Filed Weights   09/16/12 0643 09/17/12 0619  Weight: 141.8 kg (312 lb 9.8 oz) 141.4 kg (311 lb 11.7 oz)    History of present illness:  Calvin Clay is a 41 y.o. male  has a past medical history of Asthma.  Presented with  24 hours of Nausea vomiting diarrhea an severe abdominal pain. His first vomited just food. At 11 pm he had a bloody emesis. Denies any fevers or chills he overall feels very lethargic and tired. He reports severe prolonged periods of emesis and straining. He had been having periumbilical pain. No sick contacts at home.   Hospital Course:  41 yo male with acute upper Gi bleed secondary to [facility] tear in setting of probable viral gastroenteritis. He underwent UGI endo by GI and found to have tear. Pt is doing better. He will continue oral protonix and zofran at this time and f/u with PCP. His AGI was improved.  He will cont soft diet x 48 hours.  Consultants: GI  Procedures: UGI endoscopy  Antibiotics: none   Procedures: UGI endo Consultations:  GI  Discharge Exam: Filed Vitals:   09/17/12 0700 09/17/12 0729 09/17/12 0800 09/17/12 1100  BP: 102/73  116/72 140/90  Pulse: 73  87   Temp:   97.9 F (36.6 C) 97.9 F (36.6 C)  TempSrc:   Oral Oral  Resp: 18  16   Height:      Weight:      SpO2: 98% 100% 98% 98%    General: A, O x 3, NAD Cardiovascular: S1S2 regular, no m/r/g Respiratory: CTAB, no w/r/c  Discharge Instructions     Medication List     As of 09/17/2012  2:01 PM    TAKE  these medications         albuterol 108 (90 BASE) MCG/ACT inhaler   Commonly known as: PROVENTIL HFA;VENTOLIN HFA   Inhale 1 puff into the lungs 2 (two) times daily.      aspirin-acetaminophen-caffeine 250-250-65 MG per tablet   Commonly known as: EXCEDRIN MIGRAINE   Take 1 tablet by mouth every 6 (six) hours as needed. For headache      calcium carbonate 500 MG chewable tablet   Commonly known as: TUMS - dosed in mg elemental calcium   Chew 1 tablet by mouth daily.      mometasone-formoterol 100-5 MCG/ACT Aero   Commonly known as: DULERA   Inhale 2 puffs into the lungs 2 (two) times daily.      ondansetron 4 MG tablet   Commonly known as: ZOFRAN   Take 1 tablet (4 mg total) by mouth every 6 (six) hours as needed for nausea.      pantoprazole 40 MG tablet   Commonly known as: PROTONIX   Take 1 tablet (40 mg total) by mouth daily.      SUMAtriptan 100 MG tablet   Commonly known as: IMITREX   Take 100 mg by mouth every 2 (two) hours as needed. For headache      topiramate 50 MG tablet   Commonly known as:  TOPAMAX   Take 50 mg by mouth daily as needed. For headaches          The results of significant diagnostics from this hospitalization (including imaging, microbiology, ancillary and laboratory) are listed below for reference.    Significant Diagnostic Studies: Dg Abd Acute W/chest  09/16/2012  *RADIOLOGY REPORT*  Clinical Data: Nausea/vomiting/diarrhea, hypertension, hematemesis  ACUTE ABDOMEN SERIES (ABDOMEN 2 VIEW & CHEST 1 VIEW)  Comparison: None.  Findings: Lungs are clear. No pleural effusion or pneumothorax.  Cardiomediastinal silhouette is within normal limits.  Nonobstructive bowel gas pattern.  No evidence of free air under the diaphragm on the upright view.  Visualized osseous structures are within normal limits.  9 mm calcification overlying the right lower pelvis.  IMPRESSION: No evidence of acute cardiopulmonary disease.  No evidence of small bowel obstruction  or free air.  9 mm calcification overlying the right lower pelvis, nonspecific, ureteral/bladder calculus not excluded.   Original Report Authenticated By: Charline Bills, M.D.     Microbiology: Recent Results (from the past 240 hour(s))  MRSA PCR SCREENING     Status: Normal   Collection Time   09/16/12  6:51 AM      Component Value Range Status Comment   MRSA by PCR NEGATIVE  NEGATIVE Final      Labs: Basic Metabolic Panel:  Lab 09/17/12 5409 09/16/12 1156 09/16/12 0042  NA 138 137 134*  K 4.0 3.9 3.8  CL 106 106 100  CO2 24 23 21   GLUCOSE 94 108* 125*  BUN 10 16 19   CREATININE 0.93 1.01 1.22  CALCIUM 8.8 8.3* 9.2  MG 2.0 -- --  PHOS 3.0 -- --   Liver Function Tests:  Lab 09/17/12 0545 09/16/12 0042  AST 21 29  ALT 33 37  ALKPHOS 59 82  BILITOT 0.3 0.6  PROT 6.3 7.4  ALBUMIN 3.0* 3.3*   No results found for this basename: LIPASE:5,AMYLASE:5 in the last 168 hours No results found for this basename: AMMONIA:5 in the last 168 hours CBC:  Lab 09/17/12 0545 09/17/12 0102 09/16/12 1838 09/16/12 1156 09/16/12 0042  WBC 7.3 6.9 7.5 6.0 12.5*  NEUTROABS -- -- -- 4.2 10.1*  HGB 11.3* 11.2* 12.2* 11.6* 13.7  HCT 35.1* 34.1* 37.9* 36.0* 41.7  MCV 83.8 83.2 84.2 83.5 83.1  PLT 235 236 273 215 272   Cardiac Enzymes: No results found for this basename: CKTOTAL:5,CKMB:5,CKMBINDEX:5,TROPONINI:5 in the last 168 hours BNP: BNP (last 3 results) No results found for this basename: PROBNP:3 in the last 8760 hours CBG: No results found for this basename: GLUCAP:5 in the last 168 hours  Time coordinating discharge: 45 minutes  Signed:  Yides Saidi V.  [facility] 09/17/2012, 2:01 PM

## 2012-09-17 NOTE — Progress Notes (Signed)
Patient ID: Calvin Clay, male   DOB: January 10, 1971, 41 y.o.   MRN: [id number] [facility] Progress Note  Subjective: Feels good- no nausea, mild upper abdominal discomfort, no diarrhea  Objective:  Vital signs in last 24 hours: Temp:  [97.8 F (36.6 C)-99 F (37.2 C)] 97.9 F (36.6 C) (10/13 0800) Pulse Rate:  [73-95] 87  (10/13 0800) Resp:  [13-20] 16  (10/13 0800) BP: (102-147)/(48-92) 116/72 mmHg (10/13 0800) SpO2:  [94 %-100 %] 98 % (10/13 0800) Weight:  [311 lb 11.7 oz (141.4 kg)] 311 lb 11.7 oz (141.4 kg) (10/13 0619) Last BM Date: 09/14/12 General:   Alert,  Well-developed,    in NAD Heart:  Regular rate and rhythm; no murmurs Pulm;clear Abdomen:  Soft, nontender and nondistended. Normal bowel sounds,Extremities:  Without edema. Neurologic:  Alert and  oriented x4;  grossly normal neurologically. Psych:  Alert and cooperative. Normal mood and affect.  Intake/Output from previous day: 10/12 0701 - 10/13 0700 In: 3307 [P.O.:1800; I.V.:1503; IV Piggyback:4] Out: 5700 [Urine:5700] Intake/Output this shift: Total I/O In: 25 [I.V.:25] Out: 700 [Urine:700]  Lab Results:  [facility] 09/17/12 0545 09/17/12 0102 09/16/12 1838  WBC 7.3 6.9 7.5  HGB 11.3* 11.2* 12.2*  HCT 35.1* 34.1* 37.9*  PLT 235 236 273   BMET  Basename 09/17/12 0545 09/16/12 1156 09/16/12 0042  NA 138 137 134*  K 4.0 3.9 3.8  CL 106 106 100  CO2 24 23 21   GLUCOSE 94 108* 125*  BUN 10 16 19   CREATININE 0.93 1.01 1.22  CALCIUM 8.8 8.3* 9.2   LFT  Basename 09/17/12 0545  PROT 6.3  ALBUMIN 3.0*  AST 21  ALT 33  ALKPHOS 59  BILITOT 0.3  BILIDIR --  IBILI --   PT/INR  Basename 09/16/12 0042  LABPROT 14.0  INR 1.09    Assessment / Plan: 41 yo male with acute upper Gi bleed secondary to [facility] Weiss tear insetting of probable viral gastroenteritis. He is stable and gastroenteritis seems to have resolved No Endoscopic therapy needed - no active bleeding at time of EGD and clot  overlying tear  Ok to advance diet to full liquid next 24 hours then very soft over next 48 hours Ok for discharge to home to rest Protonix BID X 2 weeks the daily x one month Zofran 4 mg prn nausea for home  Active Problems:  GI bleed  Hematemesis  Hypotension  Migraine  Mallory-Weiss tear    LOS: 1 day   Amy Esterwood  09/17/2012, 9:30 AM    I have taken an interval history, reviewed the chart and examined the patient. I agree with the extender's note, impression and recommendations. Mallory Weiss tear and mild duodenitis. Continue PPI as outlined above. Advance diet slowly over next 2-3 days as outlined above. OK for discharge from GI standpoint with rest at home for 2-3 days. GI follow up as needed. GI signing off.  Venita Lick. Russella Dar MD [facility]

## 2012-09-17 NOTE — Progress Notes (Signed)
Pt discharged to home via wheelchair in personal vehicle in care of wife who was driving. Discharge instructions given and patient verbalizes understanding through teach-back method. Prescriptions for Protonix and Zofran given and teaching completed. Vital signs stable at time of discharge, no nausea or vomiting reported. IV access line x2 removed without difficulty.

## 2012-09-18 ENCOUNTER — Encounter (HOSPITAL_COMMUNITY): Payer: Self-pay | Admitting: Gastroenterology

## 2014-05-25 ENCOUNTER — Emergency Department (HOSPITAL_COMMUNITY)
Admission: EM | Admit: 2014-05-25 | Discharge: 2014-05-25 | Disposition: A | Discharge status: Home / Self Care | Payer: MEDICAID | Attending: Emergency Medicine | Admitting: Emergency Medicine

## 2014-05-25 ENCOUNTER — Encounter (HOSPITAL_COMMUNITY): Payer: Self-pay | Admitting: Emergency Medicine

## 2014-05-25 DIAGNOSIS — J45909 Unspecified asthma, uncomplicated: Secondary | ICD-10-CM | POA: Insufficient documentation

## 2014-05-25 DIAGNOSIS — Z79899 Other long term (current) drug therapy: Secondary | ICD-10-CM | POA: Insufficient documentation

## 2014-05-25 DIAGNOSIS — M7989 Other specified soft tissue disorders: Secondary | ICD-10-CM

## 2014-05-25 DIAGNOSIS — M79605 Pain in left leg: Secondary | ICD-10-CM

## 2014-05-25 DIAGNOSIS — M79609 Pain in unspecified limb: Secondary | ICD-10-CM | POA: Insufficient documentation

## 2014-05-25 LAB — COMPREHENSIVE METABOLIC PANEL
ALBUMIN: 3.5 g/dL (ref 3.5–5.2)
ALT: 21 U/L (ref 0–53)
AST: 27 U/L (ref 0–37)
Alkaline Phosphatase: 91 U/L (ref 39–117)
BUN: 13 mg/dL (ref 6–23)
CALCIUM: 9.5 mg/dL (ref 8.4–10.5)
CO2: 24 meq/L (ref 19–32)
Chloride: 100 mEq/L (ref 96–112)
Creatinine, Ser: 1.05 mg/dL (ref 0.50–1.35)
GFR calc Af Amer: >90 mL/min (ref >90)
GFR, EST NON AFRICAN AMERICAN: 87 mL/min — AB (ref >90)
Glucose, Bld: 100 mg/dL — ABNORMAL HIGH (ref 70–99)
Potassium: 4.3 mEq/L (ref 3.7–5.3)
SODIUM: 138 meq/L (ref 137–147)
TOTAL PROTEIN: 7.8 g/dL (ref 6.0–8.3)
Total Bilirubin: 0.3 mg/dL (ref 0.3–1.2)

## 2014-05-25 LAB — CBC
HCT: 42.3 % (ref 39.0–52.0)
Hemoglobin: 13.8 g/dL (ref 13.0–17.0)
MCH: 27.4 pg (ref 26.0–34.0)
MCHC: 32.6 g/dL (ref 30.0–36.0)
MCV: 83.9 fL (ref 78.0–100.0)
PLATELETS: 287 10*3/uL (ref 150–400)
RBC: 5.04 MIL/uL (ref 4.22–5.81)
RDW: 14.3 % (ref 11.5–15.5)
WBC: 10 10*3/uL (ref 4.0–10.5)

## 2014-05-25 MED ORDER — RIVAROXABAN 15 MG PO TABS
15.0000 mg | ORAL_TABLET | Freq: Once | ORAL | Status: AC
  Administered 2014-05-25: 15 mg via ORAL
  Filled 2014-05-25: qty 1

## 2014-05-25 NOTE — Discharge Instructions (Signed)
I am unsure if you have a DVT, please obtain ultrasound tomorrow morning after 0830 at [facility]

## 2014-05-25 NOTE — ED Provider Notes (Signed)
CSN: [id number]     Arrival date & time 05/25/14  1910 History   First MD Initiated Contact with Patient 05/25/14 1928     Chief Complaint  Patient presents with  . Leg Pain     (Consider location/radiation/quality/duration/timing/severity/associated sxs/prior Treatment) Patient is a 43 y.o. male presenting with leg pain.  Leg Pain Location:  Knee Injury: no   Pain details:    Quality:  Aching and sharp   Radiates to:  Does not radiate   Severity:  Mild   Onset quality:  Gradual   Duration:  2 months   Timing:  Constant   Progression:  Worsening Chronicity:  New Associated symptoms: no back pain, no fever and no neck pain     Past Medical History  Diagnosis Date  . Asthma    Past Surgical History  Procedure Laterality Date  . Cosmetic surgery    . Esophagogastroduodenoscopy  09/16/2012    Procedure: ESOPHAGOGASTRODUODENOSCOPY (EGD);  Surgeon: Ladene Artist, MD,FACG;  Location: [facility] ENDOSCOPY;  Service: Endoscopy;  Laterality: N/A;   Family History  Problem Relation Age of Onset  . Colon cancer Mother   . Colon cancer Father    History  Substance Use Topics  . Smoking status: Never Smoker   . Smokeless tobacco: Not on file  . Alcohol Use: Yes     Comment: occasional    Review of Systems  Constitutional: Negative for fever and chills.  HENT: Negative for congestion and rhinorrhea.   Eyes: Negative for pain.  Respiratory: Negative for cough and shortness of breath.   Cardiovascular: Positive for leg swelling. Negative for chest pain and palpitations.  Gastrointestinal: Negative for vomiting, abdominal pain, diarrhea and constipation.  Endocrine: Negative for polydipsia and polyuria.  Genitourinary: Negative for dysuria and flank pain.  Musculoskeletal: Negative for back pain and neck pain.  Skin: Negative for color change and wound.  Neurological: Negative for dizziness, numbness and headaches.      Allergies  Iodine and Shrimp  Home Medications    Prior to Admission medications   Medication Sig Start Date End Date Taking? Authorizing Provider  albuterol (PROVENTIL HFA;VENTOLIN HFA) 108 (90 BASE) MCG/ACT inhaler Inhale 1 puff into the lungs 2 (two) times daily.   Yes Historical Provider, MD  meloxicam (MOBIC) 7.5 MG tablet Take 7.5 mg by mouth daily as needed for pain.   Yes Historical Provider, MD  mometasone-formoterol (DULERA) 100-5 MCG/ACT AERO Inhale 2 puffs into the lungs 2 (two) times daily.   Yes Historical Provider, MD  Multiple Vitamin (MULTIVITAMIN WITH MINERALS) TABS tablet Take 3 tablets by mouth daily.   Yes Historical Provider, MD   BP 130/79  Pulse 93  Temp(Src) 98.8 F (37.1 C) (Oral)  Resp 18  Ht 6\' 3"  (1.905 m)  Wt 309 lb 7 oz (140.36 kg)  BMI 38.68 kg/m2  SpO2 95% Physical Exam  Nursing note and vitals reviewed. Constitutional: He is oriented to person, place, and time. He appears well-developed and well-nourished.  HENT:  Head: Normocephalic and atraumatic.  Eyes: Conjunctivae and EOM are normal. Pupils are equal, round, and reactive to light.  Neck: Normal range of motion.  Cardiovascular: Normal rate and regular rhythm.   Pulmonary/Chest: Effort normal and breath sounds normal.  Abdominal: Soft. He exhibits no distension. There is no tenderness.  Musculoskeletal: Normal range of motion. He exhibits edema and tenderness (medial left knee).  Neurological: He is alert and oriented to person, place, and time.  Skin: Skin is warm  and dry.    ED Course  Procedures (including critical care time) Labs Review Labs Reviewed  COMPREHENSIVE METABOLIC PANEL - Abnormal; Notable for the following:    Glucose, Bld 100 (*)    GFR calc non Af Amer 87 (*)    All other components within normal limits  CBC    Imaging Review No results found.   EKG Interpretation None      MDM   Final diagnoses:  Pain of left lower extremity  Swelling of left lower extremity    43 yo M w/ h/o ashtma here with  swelling around medial left knee with erythema and pain for last 2 months, acutely worsened over last two days. No respiratory symptoms. Appears well on exam with mild swelling and tenderness in greater saphenous area of left leg. Doubt bursitis or cellulitis or septic joint. Likely superficial thrombosis v thrombophlebitis. Doubt PE. Will give xarelto here, check labs and have him come back in AM for DVT study. Stable for d/c.     Merrily Pew, MD 05/26/14 [phone]

## 2014-05-25 NOTE — ED Notes (Signed)
Lt . Lateral inner leg near the knee has an area that is swollen and painful to touch.  Pt. Noticed this spot a few months ago and it has been increasing in size and pain.  Denies any injury

## 2014-05-25 NOTE — ED Notes (Signed)
Pt ambulating independently w/ steady gait on d/c in no acute distress, A&Ox4. D/c instructions reviewed w/ pt and family - pt and family deny any further questions or concerns at present. Pt instructed to return in the a.m. For vasc ultrasound - pt verbalized understanding and understands symptoms for return.

## 2014-05-26 ENCOUNTER — Ambulatory Visit (HOSPITAL_COMMUNITY)
Admission: RE | Admit: 2014-05-26 | Discharge: 2014-05-26 | Disposition: A | Discharge status: Home / Self Care | Payer: BC Managed Care – PPO | Source: Ambulatory Visit | Attending: Emergency Medicine | Admitting: Emergency Medicine

## 2014-05-26 DIAGNOSIS — M79609 Pain in unspecified limb: Secondary | ICD-10-CM | POA: Insufficient documentation

## 2014-05-26 DIAGNOSIS — M7989 Other specified soft tissue disorders: Secondary | ICD-10-CM | POA: Insufficient documentation

## 2014-05-26 DIAGNOSIS — I8000 Phlebitis and thrombophlebitis of superficial vessels of unspecified lower extremity: Secondary | ICD-10-CM | POA: Insufficient documentation

## 2014-05-26 NOTE — Progress Notes (Signed)
VASCULAR LAB PRELIMINARY  PRELIMINARY  PRELIMINARY  PRELIMINARY  Left lower extremity venous Doppler completed.    Preliminary report:  There is no DVT or SVT noted in the left lower extremity.  There is phlebitis noted in GSV at medial knee with no thrombosis noted.   KANADY, CANDACE, RVT 05/26/2014, 9:01 AM

## 2014-05-26 NOTE — ED Provider Notes (Signed)
Medical screening examination/treatment/procedure(s) were conducted as a shared visit with non-physician practitioner(s) and myself.  I personally evaluated the patient during the encounter.   EKG Interpretation None      Patient here with leg pain - likely supericial thrombophlebitis, but will give xarelto and obtain DVT study tomorrow morning. Also stated chest pain - started with bowling, worse with movement and twisting, likely musculoskeletal. Stable for discharge.   Osvaldo Shipper, MD 05/26/14 [phone]

## 2015-09-27 ENCOUNTER — Encounter (HOSPITAL_COMMUNITY): Payer: Self-pay | Admitting: Emergency Medicine

## 2015-09-27 ENCOUNTER — Emergency Department (HOSPITAL_COMMUNITY): Payer: Self-pay

## 2015-09-27 ENCOUNTER — Emergency Department (HOSPITAL_COMMUNITY)
Admission: EM | Admit: 2015-09-27 | Discharge: 2015-09-27 | Disposition: A | Discharge status: Home / Self Care | Payer: Self-pay | Attending: Emergency Medicine | Admitting: Emergency Medicine

## 2015-09-27 DIAGNOSIS — Z79899 Other long term (current) drug therapy: Secondary | ICD-10-CM | POA: Insufficient documentation

## 2015-09-27 DIAGNOSIS — R109 Unspecified abdominal pain: Secondary | ICD-10-CM

## 2015-09-27 DIAGNOSIS — J45909 Unspecified asthma, uncomplicated: Secondary | ICD-10-CM | POA: Insufficient documentation

## 2015-09-27 DIAGNOSIS — N200 Calculus of kidney: Secondary | ICD-10-CM | POA: Insufficient documentation

## 2015-09-27 LAB — CBC
HCT: 42.5 % (ref 39.0–52.0)
HEMOGLOBIN: 13.7 g/dL (ref 13.0–17.0)
MCH: 27.1 pg (ref 26.0–34.0)
MCHC: 32.2 g/dL (ref 30.0–36.0)
MCV: 84 fL (ref 78.0–100.0)
PLATELETS: 259 10*3/uL (ref 150–400)
RBC: 5.06 MIL/uL (ref 4.22–5.81)
RDW: 14.6 % (ref 11.5–15.5)
WBC: 8.7 10*3/uL (ref 4.0–10.5)

## 2015-09-27 LAB — BASIC METABOLIC PANEL
ANION GAP: 10 (ref 5–15)
BUN: 12 mg/dL (ref 6–20)
CALCIUM: 9.9 mg/dL (ref 8.9–10.3)
CO2: 26 mmol/L (ref 22–32)
CREATININE: 1.28 mg/dL — AB (ref 0.61–1.24)
Chloride: 104 mmol/L (ref 101–111)
Glucose, Bld: 128 mg/dL — ABNORMAL HIGH (ref 65–99)
Potassium: 4.5 mmol/L (ref 3.5–5.1)
SODIUM: 140 mmol/L (ref 135–145)

## 2015-09-27 LAB — URINE MICROSCOPIC-ADD ON

## 2015-09-27 LAB — URINALYSIS, ROUTINE W REFLEX MICROSCOPIC
Bilirubin Urine: NEGATIVE
GLUCOSE, UA: NEGATIVE mg/dL
KETONES UR: 15 mg/dL — AB
LEUKOCYTES UA: NEGATIVE
Nitrite: NEGATIVE
PROTEIN: NEGATIVE mg/dL
Specific Gravity, Urine: 1.022 (ref 1.005–1.030)
UROBILINOGEN UA: 1 mg/dL (ref 0.0–1.0)
pH: 6 (ref 5.0–8.0)

## 2015-09-27 MED ORDER — MORPHINE SULFATE (PF) 4 MG/ML IV SOLN
6.0000 mg | Freq: Once | INTRAVENOUS | Status: AC
  Administered 2015-09-27: 6 mg via INTRAVENOUS
  Filled 2015-09-27: qty 2

## 2015-09-27 MED ORDER — SODIUM CHLORIDE 0.9 % IV BOLUS (SEPSIS)
1000.0000 mL | Freq: Once | INTRAVENOUS | Status: AC
  Administered 2015-09-27: 1000 mL via INTRAVENOUS

## 2015-09-27 MED ORDER — HYDROCODONE-ACETAMINOPHEN 5-325 MG PO TABS: 2.0000 | ORAL_TABLET | Freq: Two times a day (BID) | ORAL | Status: DC | PRN

## 2015-09-27 MED ORDER — KETOROLAC TROMETHAMINE 30 MG/ML IJ SOLN
30.0000 mg | Freq: Once | INTRAMUSCULAR | Status: AC
  Administered 2015-09-27: 30 mg via INTRAVENOUS
  Filled 2015-09-27: qty 1

## 2015-09-27 NOTE — Discharge Instructions (Signed)
Kidney Stones Mr. Hannis, See a urologist within 3 days for close follow-up of your kidney stones. Take ibuprofen as needed for pain. If your pain becomes severe take Norco. If any symptoms worsen come back to emergency department immediately. Thank you. Kidney stones (urolithiasis) are solid masses that form inside your kidneys. The intense pain is caused by the stone moving through the kidney, ureter, bladder, and urethra (urinary tract). When the stone moves, the ureter starts to spasm around the stone. The stone is usually passed in your pee (urine).  HOME CARE  Drink enough fluids to keep your pee clear or pale yellow. This helps to get the stone out.  Take a 24-hour pee (urine) sample as told by your doctor. You may need to take another sample every 6-12 months.  Strain all pee through the provided strainer. Do not pee without peeing through the strainer, not even once. If you pee the stone out, catch it in the strainer. The stone may be as small as a grain of salt. Take this to your doctor. This will help your doctor figure out what you can do to try to prevent more kidney stones.  Only take medicine as told by your doctor.  Make changes to your daily diet as told by your doctor. You may be told to:  Limit how much salt you eat.  Eat 5 or more servings of fruits and vegetables each day.  Limit how much meat, poultry, fish, and eggs you eat.  Keep all follow-up visits as told by your doctor. This is important.  Get follow-up X-rays as told by your doctor. GET HELP IF: You have pain that gets worse even if you have been taking pain medicine. GET HELP RIGHT AWAY IF:   Your pain does not get better with medicine.  You have a fever or shaking chills.  Your pain increases and gets worse over 18 hours.  You have new belly (abdominal) pain.  You feel faint or pass out.  You are unable to pee.   This information is not intended to replace advice given to you by your health care  provider. Make sure you discuss any questions you have with your health care provider.   Document Released: 05/10/2008 Document Revised: 08/13/2015 Document Reviewed: 04/25/2013 Elsevier Interactive Patient Education [employer].

## 2015-09-27 NOTE — ED Provider Notes (Signed)
CSN: [id number]     Arrival date & time 09/27/15  0230 History  By signing my name below, I, Calvin Clay, attest that this documentation has been prepared under the direction and in the presence of Calvin Balls, MD. Electronically Signed: Soijett Clay, ED Scribe. 09/27/2015. 2:49 AM.   Chief Complaint  Patient presents with  . Flank Pain      The history is provided by the patient. No language interpreter was used.    HPI Comments: Calvin Clay is a 44 y.o. male who presents to the Emergency Department complaining of intermittent right flank pain onset this evening. He notes that his right flank pain radiates to his right lower abdomen and right groin. He states that he was unable to lay on his side tonight due to his right flank pain. He denies having a medical hx of kidney stones in the past but the pt thinks that he may have kidney stones. He states that he is having associated symptoms of nausea, urinary hesitancy, urinary color change to a dark color. He states that he has tried 3 OTC ibuprofen 2 hours ago with no relief for his symptoms. He denies penile swelling/pain and any other symptoms.   Past Medical History  Diagnosis Date  . Asthma    Past Surgical History  Procedure Laterality Date  . Cosmetic surgery    . Esophagogastroduodenoscopy  09/16/2012    Procedure: ESOPHAGOGASTRODUODENOSCOPY (EGD);  Surgeon: Ladene Artist, MD,FACG;  Location: [facility] ENDOSCOPY;  Service: Endoscopy;  Laterality: N/A;   Family History  Problem Relation Age of Onset  . Colon cancer Mother   . Colon cancer Father    Social History  Substance Use Topics  . Smoking status: Never Smoker   . Smokeless tobacco: None  . Alcohol Use: Yes     Comment: occasional    Review of Systems  10 Systems reviewed and all are negative for acute change except as noted in the HPI.   Allergies  Iodine and Shrimp  Home Medications   Prior to Admission medications   Medication Sig Start Date End Date  Taking? Authorizing Provider  albuterol (PROVENTIL HFA;VENTOLIN HFA) 108 (90 BASE) MCG/ACT inhaler Inhale 1 puff into the lungs 2 (two) times daily.    Historical Provider, MD  meloxicam (MOBIC) 7.5 MG tablet Take 7.5 mg by mouth daily as needed for pain.    Historical Provider, MD  mometasone-formoterol (DULERA) 100-5 MCG/ACT AERO Inhale 2 puffs into the lungs 2 (two) times daily.    Historical Provider, MD  Multiple Vitamin (MULTIVITAMIN WITH MINERALS) TABS tablet Take 3 tablets by mouth daily.    Historical Provider, MD   BP 158/77 mmHg  Pulse 83  Temp(Src) 98.5 F (36.9 C) (Oral)  Resp 20  Ht 6\' 3"  (1.905 m)  Wt 309 lb (140.161 kg)  BMI 38.62 kg/m2  SpO2 96% Physical Exam  Constitutional: He is oriented to person, place, and time. Vital signs are normal. He appears well-developed and well-nourished.  Non-toxic appearance. He does not appear ill. No distress.  HENT:  Head: Normocephalic and atraumatic.  Nose: Nose normal.  Mouth/Throat: Oropharynx is clear and moist. No oropharyngeal exudate.  Eyes: Conjunctivae and EOM are normal. Pupils are equal, round, and reactive to light. No scleral icterus.  Neck: Normal range of motion. Neck supple. No tracheal deviation, no edema, no erythema and normal range of motion present. No thyroid mass and no thyromegaly present.  Cardiovascular: Normal rate, regular rhythm, S1 normal, S2 normal,  normal heart sounds, intact distal pulses and normal pulses.  Exam reveals no gallop and no friction rub.   No murmur heard. Pulses:      Radial pulses are 2+ on the right side, and 2+ on the left side.       Dorsalis pedis pulses are 2+ on the right side, and 2+ on the left side.  Pulmonary/Chest: Effort normal and breath sounds normal. No respiratory distress. He has no wheezes. He has no rhonchi. He has no rales.  Abdominal: Soft. Normal appearance and bowel sounds are normal. He exhibits no distension, no ascites and no mass. There is no  hepatosplenomegaly. There is no tenderness. There is no rebound, no guarding and no CVA tenderness.  Musculoskeletal: Normal range of motion. He exhibits no edema or tenderness.  Lymphadenopathy:    He has no cervical adenopathy.  Neurological: He is alert and oriented to person, place, and time. He has normal strength. No cranial nerve deficit or sensory deficit.  Skin: Skin is warm, dry and intact. No petechiae and no rash noted. He is not diaphoretic. No erythema. No pallor.  Psychiatric: He has a normal mood and affect. His behavior is normal. Judgment normal.  Nursing note and vitals reviewed.   ED Course  Procedures (including critical care time) DIAGNOSTIC STUDIES: Oxygen Saturation is 96% on RA, nl by my interpretation.    COORDINATION OF CARE: 2:47 AM Discussed treatment plan with pt at bedside which includes UA and labs and pt agreed to plan.    Labs Review Labs Reviewed  BASIC METABOLIC PANEL - Abnormal; Notable for the following:    Glucose, Bld 128 (*)    Creatinine, Ser 1.28 (*)    All other components within normal limits  URINALYSIS, ROUTINE W REFLEX MICROSCOPIC (NOT AT [facility]) - Abnormal; Notable for the following:    Color, Urine AMBER (*)    APPearance CLOUDY (*)    Hgb urine dipstick LARGE (*)    Ketones, ur 15 (*)    All other components within normal limits  CBC  URINE MICROSCOPIC-ADD ON    Imaging Review Ct Renal Stone Study  09/27/2015  CLINICAL DATA:  Right flank pain.  Pain radiates to the groin. EXAM: CT ABDOMEN AND PELVIS WITHOUT CONTRAST TECHNIQUE: Multidetector CT imaging of the abdomen and pelvis was performed following the standard protocol without IV contrast. COMPARISON:  None. FINDINGS: Lower chest:  The included lung bases are clear. Liver: Diffusely decreased density consistent with steatosis. No evidence of focal lesion. Hepatobiliary: Gallbladder minimally distended. No biliary dilatation. Pancreas: Normal. Spleen: Normal. Adrenal glands: No  nodule. Kidneys: There is a 4 x 7 mm obstructing stone at the right ureterovesicular junction with mild resultant hydroureteronephrosis. No significant perinephric stranding. Punctate nonobstructing stone in the right mid kidney. No left urolithiasis or obstructive uropathy. Stomach/Bowel: Stomach physiologically distended with ingested contents. There are no dilated or thickened small bowel loops. Small volume of stool throughout the colon without colonic wall thickening. There is diverticulosis throughout the entire colon, most significant involving the right colon, no diverticulitis. The appendix is normal. Vascular/Lymphatic: No retroperitoneal adenopathy. Abdominal aorta is normal in caliber. Reproductive: Prostate gland normal in size. Bladder: Minimally distended, mild wall thickening. Other: No free air, free fluid, or intra-abdominal fluid collection. Small fat containing umbilical hernia. Musculoskeletal: There are no acute or suspicious osseous abnormalities. IMPRESSION: 1. Obstructing 4 x 7 mm stone at the right ureterovesicular junction with resultant mild hydroureteronephrosis. Additional punctate nonobstructing stone in the  mid right kidney. 2. Incidental findings of diffuse colonic diverticulosis without diverticulitis, hepatic steatosis, and fat containing umbilical hernia. Electronically Signed   By: Jeb Levering M.D.   On: 09/27/2015 03:23   I have personally reviewed and evaluated these images and lab results as part of my medical decision-making.   EKG Interpretation None      MDM   Final diagnoses:  Right flank pain    Patient presents to the ED for flank pain. CT reveals kidney stone with mild R hydro.  He was given morphine and toradol for pain control.  His pain is now down to 1.  Urology follow up was given.  Will give norco to go home with.  He appears well in no acute distress, vital signs were within his normal limits and he is safe for discharge.   I, Jenaya Saar,  personally performed the services described in this documentation. All medical record entries made by the scribe were at my direction and in my presence.  I have reviewed the chart and discharge instructions and agree that the record reflects my personal performance and is accurate and complete. Quadir Muns.  09/27/2015. 3:47 AM.     Calvin Balls, MD 09/27/15 [phone]

## 2015-09-27 NOTE — ED Notes (Signed)
Patient transported to CT 

## 2015-09-27 NOTE — ED Notes (Signed)
Pt. reports right flank pain radiating to lower abdomen and groin onset this evening , denies fever,hematuria or dysuria .

## 2016-09-16 ENCOUNTER — Emergency Department (HOSPITAL_COMMUNITY)
Admission: EM | Admit: 2016-09-16 | Discharge: 2016-09-16 | Disposition: A | Discharge status: Home / Self Care | Payer: BLUE CROSS/BLUE SHIELD | Attending: Emergency Medicine | Admitting: Emergency Medicine

## 2016-09-16 ENCOUNTER — Emergency Department (HOSPITAL_COMMUNITY): Payer: BLUE CROSS/BLUE SHIELD

## 2016-09-16 ENCOUNTER — Encounter (HOSPITAL_COMMUNITY): Payer: Self-pay | Admitting: Family Medicine

## 2016-09-16 DIAGNOSIS — K449 Diaphragmatic hernia without obstruction or gangrene: Secondary | ICD-10-CM | POA: Diagnosis not present

## 2016-09-16 DIAGNOSIS — J45909 Unspecified asthma, uncomplicated: Secondary | ICD-10-CM | POA: Diagnosis not present

## 2016-09-16 DIAGNOSIS — R1013 Epigastric pain: Secondary | ICD-10-CM | POA: Diagnosis present

## 2016-09-16 LAB — HEPATIC FUNCTION PANEL
ALT: 21 U/L (ref 17–63)
AST: 20 U/L (ref 15–41)
Albumin: 4 g/dL (ref 3.5–5.0)
Alkaline Phosphatase: 80 U/L (ref 38–126)
BILIRUBIN TOTAL: 0.4 mg/dL (ref 0.3–1.2)
Total Protein: 7.5 g/dL (ref 6.5–8.1)

## 2016-09-16 LAB — BASIC METABOLIC PANEL WITH GFR
Anion gap: 7 (ref 5–15)
BUN: 12 mg/dL (ref 6–20)
CO2: 27 mmol/L (ref 22–32)
Calcium: 9.5 mg/dL (ref 8.9–10.3)
Chloride: 104 mmol/L (ref 101–111)
Creatinine, Ser: 1.06 mg/dL (ref 0.61–1.24)
GFR calc Af Amer: >60 mL/min
GFR calc non Af Amer: >60 mL/min
Glucose, Bld: 92 mg/dL (ref 65–99)
Potassium: 4.2 mmol/L (ref 3.5–5.1)
Sodium: 138 mmol/L (ref 135–145)

## 2016-09-16 LAB — I-STAT TROPONIN, ED
TROPONIN I, POC: 0 ng/mL (ref 0.00–0.08)
TROPONIN I, POC: 0.01 ng/mL (ref 0.00–0.08)

## 2016-09-16 LAB — LIPASE, BLOOD: Lipase: 22 U/L (ref 11–51)

## 2016-09-16 LAB — CBC
HCT: 44.3 % (ref 39.0–52.0)
Hemoglobin: 14.6 g/dL (ref 13.0–17.0)
MCH: 27.4 pg (ref 26.0–34.0)
MCHC: 33 g/dL (ref 30.0–36.0)
MCV: 83.3 fL (ref 78.0–100.0)
PLATELETS: 317 10*3/uL (ref 150–400)
RBC: 5.32 MIL/uL (ref 4.22–5.81)
RDW: 14.3 % (ref 11.5–15.5)
WBC: 10 10*3/uL (ref 4.0–10.5)

## 2016-09-16 MED ORDER — RANITIDINE HCL 150 MG PO TABS: 150.0000 mg | ORAL_TABLET | Freq: Two times a day (BID) | ORAL | 0 refills | Status: DC

## 2016-09-16 MED ORDER — GI COCKTAIL ~~LOC~~
30.0000 mL | Freq: Once | ORAL | Status: AC
  Administered 2016-09-16: 30 mL via ORAL
  Filled 2016-09-16: qty 30

## 2016-09-16 NOTE — ED Provider Notes (Signed)
[facility] DEPT Provider Note   CSN: [id number] Arrival date & time: 09/16/16  1125     History   Chief Complaint Chief Complaint  Patient presents with  . Chest Pain    HPI Calvin Clay is a 45 y.o. male.  HPI Calvin Clay is a 45 y.o. male with PMH significant for Mallory Weiss tear who presents with gradual onset, waxing and waning, non-radiating, improving, substernal and right sided chest pain described as pressure over the last 4 days.  Associated symptoms include epigastric abdominal pain.  Denies fever, cough, SOB, numbness, weakness, N/V/D.  No cardiac history.  No family history.  No tobacco use.  Not exertional.  Somewhat relieved with antacids.  Past Medical History:  Diagnosis Date  . Asthma     Patient Active Problem List   Diagnosis Date Noted  . GI bleed 09/16/2012  . Hematemesis 09/16/2012  . Hypotension 09/16/2012  . Migraine 09/16/2012  . 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 COSMETIC SURGERY    . ESOPHAGOGASTRODUODENOSCOPY  09/16/2012   Procedure: ESOPHAGOGASTRODUODENOSCOPY (EGD);  Surgeon: Ladene Artist, MD,FACG;  Location: [facility] ENDOSCOPY;  Service: Endoscopy;  Laterality: N/A;       Home Medications    Prior to Admission medications   Medication Sig Start Date End Date Taking? Authorizing Provider  albuterol (PROVENTIL HFA;VENTOLIN HFA) 108 (90 BASE) MCG/ACT inhaler Inhale 1 puff into the lungs 2 (two) times daily.   Yes Historical Provider, MD  mometasone-formoterol (DULERA) 100-5 MCG/ACT AERO Inhale 2 puffs into the lungs 2 (two) times daily.   Yes Historical Provider, MD  HYDROcodone-acetaminophen (NORCO/VICODIN) 5-325 MG tablet Take 2 tablets by mouth 2 (two) times daily as needed for severe pain. 09/27/15   Everlene Balls, MD  ranitidine (ZANTAC) 150 MG tablet Take 1 tablet (150 mg total) by mouth 2 (two) times daily. 09/16/16   Gloriann Loan, PA-C    Family History Family History    Problem Relation Age of Onset  . Colon cancer Mother   . Colon cancer Father     Social History Social History  Substance Use Topics  . Smoking status: Never Smoker  . Smokeless tobacco: Not on file  . Alcohol use Yes     Comment: occasional     Allergies   Iodine and Shrimp [shellfish allergy]   Review of Systems Review of Systems All other systems negative unless otherwise stated in HPI   Physical Exam Updated Vital Signs BP 128/68   Pulse 72   Temp 98.6 F (37 C) (Oral)   Resp 10   SpO2 98%   Physical Exam  Constitutional: He is oriented to person, place, and time. He appears well-developed and well-nourished.  Non-toxic appearance. He does not have a sickly appearance. He does not appear ill.  HENT:  Head: Normocephalic and atraumatic.  Mouth/Throat: Oropharynx is clear and moist.  Eyes: Conjunctivae are normal. Pupils are equal, round, and reactive to light.  Neck: Normal range of motion. Neck supple.  Cardiovascular: Normal rate, regular rhythm and normal heart sounds.   Pulses:      Radial pulses are 2+ on the right side, and 2+ on the left side.  Pulmonary/Chest: Effort normal and breath sounds normal. No accessory muscle usage or stridor. No respiratory distress. He has no wheezes. He has no rhonchi. He has no rales.  Abdominal: Soft. Bowel sounds are normal. He exhibits no distension. There is tenderness in the right upper quadrant.  There is no rebound and no guarding.  Musculoskeletal: Normal range of motion.  Lymphadenopathy:    He has no cervical adenopathy.  Neurological: He is alert and oriented to person, place, and time.  Speech clear without dysarthria.  Skin: Skin is warm and dry.  Psychiatric: He has a normal mood and affect. His behavior is normal.     ED Treatments / Results  Labs (all labs ordered are listed, but only abnormal results are displayed) Labs Reviewed  HEPATIC FUNCTION PANEL - Abnormal; Notable for the following:        Result Value   Bilirubin, Direct <0.1 (*)    All other components within normal limits  BASIC METABOLIC PANEL  CBC  LIPASE, BLOOD  I-STAT TROPOININ, ED  I-STAT TROPOININ, ED    EKG  EKG Interpretation  Date/Time:  Thursday September 16 2016 11:28:59 EDT Ventricular Rate:  84 PR Interval:  174 QRS Duration: 84 QT Interval:  354 QTC Calculation: 418 R Axis:   88 Text Interpretation:  Normal sinus rhythm Cannot rule out Anterior infarct , age undetermined Abnormal ECG No prior EKG Confirmed by Jeneen Rinks  MD, [place] ([id number]) on 09/16/2016 11:31:38 AM Also confirmed by Jeneen Rinks  MD, [place] ([id number]), editor [place], Joelene Millin [phone])  on 09/16/2016 12:15:54 PM       Radiology Dg Chest 2 View  Result Date: 09/16/2016 CLINICAL DATA:  Sharp chest pain 3 days ago now all more of a pressure sensation and is now intermittent. History of asthma. EXAM: CHEST  2 VIEW COMPARISON:  Chest x-ray included with an acute abdominal series of September 16, 2012 FINDINGS: The lungs are reasonably well inflated. There is no focal infiltrate. There is no pleural effusion or pneumothorax. The heart and pulmonary vascularity are normal. The mediastinum is normal in width. The bony thorax exhibits no acute abnormality. There may be a hiatal hernia containing an air versus air in the mildly distended its lower esophagus. IMPRESSION: No acute cardiopulmonary abnormality. Air in the distal esophagus versus in a hiatal hernia. Electronically Signed   By: David  Martinique M.D.   On: 09/16/2016 12:23    Procedures Procedures (including critical care time)  Medications Ordered in ED Medications  gi cocktail (Maalox,Lidocaine,Donnatal) (30 mLs Oral Given 09/16/16 1303)     Initial Impression / Assessment and Plan / ED Course  I have reviewed the triage vital signs and the nursing notes.  Pertinent labs & imaging results that were available during my care of the patient were reviewed by me and considered in my medical decision making  (see chart for details).  Clinical Course   Patient presents with atypical chest pain.  VSS, NAD.  On exam, heart sounds normal, lungs clear, abdomen soft with RUQ tenderness with no rebound, guarding, or rigidity.  Labs without acute abnormalities.  EKG NSR without acute changes, delta troponin normal.  Consider ACS, but low risk, HEART score 2.  Consider PE, but low risk Well's and PERC negative.  Doubt dissection.  Consider cholecystitis, bedside ultrasound performed by Dr. Leonette Monarch without evidence of acute cholecystitis.  CXR shows hiatal hernia, this is likely the cause of his symptoms.  Do not suspect incarcerated hernia at this time given H&P.  Will follow up with GI.  Recommend continued antacids.  Return precautions discussed.  Stable for discharge.   Case has been discussed with and seen by Dr. Leonette Monarch who agrees with the above plan for discharge.  Final Clinical Impressions(s) / ED Diagnoses   Final diagnoses:  Hiatal hernia    New Prescriptions New Prescriptions   RANITIDINE (ZANTAC) 150 MG TABLET    Take 1 tablet (150 mg total) by mouth 2 (two) times daily.     Gloriann Loan, PA-C 09/16/16 [phone]

## 2016-09-16 NOTE — ED Provider Notes (Signed)
Medical screening examination/treatment/procedure(s) were conducted as a shared visit with non-physician practitioner(s) and myself.  I personally evaluated the patient during the encounter. Briefly, the patient is a 45 y.o. male 4 days of persistent atypical chest pain. Chest pain is highly inconsistent with ACS however, EKG with age indeterminant anterior ischemic changes. No prior EKG for comparison. Feel that troponin 2 will be appropriate for ruling out ACS. Chest x-ray without evidence suggestive of pneumonia, pneumothorax, pneumomediastinum.  No abnormal contour of the mediastinum to suggest dissection. No evidence of acute injuries. However there is likely hiatal hernia, which would explain patient's symptoms. Presentation is classic for aortic dissection or esophageal perforation. Low pretest probability for pulmonary embolism. Patient did have mild epigastric and right upper quadrant tenderness. No evidence of pancreatitis or biliary obstruction. Bedside ultrasound without evidence of cholelithiasis or acute cholecystitis.  Negative troponins 2.   EMERGENCY DEPARTMENT BILIARY ULTRASOUND INTERPRETATION "Study: Limited Abdominal Ultrasound of the gallbladder and common bile duct."  INDICATIONS: Abdominal pain Indication: Multiple views of the gallbladder and common bile duct were obtained in real-time with a Multi-frequency probe." PERFORMED BY:  Myself and Midlevel IMAGES ARCHIVED?: Yes FINDINGS: Gallstones absent, Gallbladder wall normal in thickness and Sonographic Murphy's sign absent LIMITATIONS: Body Habitus and Bowel Gas INTERPRETATION: Normal  CPT Code [id number] (limited abdominal)    EKG Interpretation  Date/Time:  Thursday September 16 2016 11:28:59 EDT Ventricular Rate:  84 PR Interval:  174 QRS Duration: 84 QT Interval:  354 QTC Calculation: 418 R Axis:   88 Text Interpretation:  Normal sinus rhythm Cannot rule out Anterior infarct , age undetermined Abnormal ECG No  prior EKG Confirmed by Jeneen Rinks  MD, [place] ([id number]) on 09/16/2016 11:31:38 AM Also confirmed by Jeneen Rinks  MD, [place] ([id number]), editor [place], Joelene Millin [phone])  on 09/16/2016 12:15:54 PM           Fatima Blank, MD 09/16/16 1623

## 2016-09-16 NOTE — Discharge Instructions (Signed)
Your workup today showed evidence of a hiatal hernia.  This is likely the cause of your symptoms.  Continue taking antacids, such as Rolaids or Tums. You may also start taking Zantac twice daily for heartburn symptoms. Follow up with gastroenterology.  Return if you experience any bloody vomit, fever, severe chest or abdominal pain, or any new symptoms.

## 2016-09-16 NOTE — ED Triage Notes (Signed)
Pt presents from Urgent Care via POV with c/o CP x4 days, initially it was a stabbing pain, but has been pressure x2 days now with waxing and waning in severity.  Denies any other sx's.  A&Ox4 in NAD.

## 2017-06-29 ENCOUNTER — Emergency Department (HOSPITAL_COMMUNITY): Payer: BLUE CROSS/BLUE SHIELD

## 2017-06-29 ENCOUNTER — Encounter (HOSPITAL_COMMUNITY): Payer: Self-pay | Admitting: Emergency Medicine

## 2017-06-29 ENCOUNTER — Observation Stay (HOSPITAL_COMMUNITY)
Admission: EM | Admit: 2017-06-29 | Discharge: 2017-06-30 | Disposition: A | Discharge status: Home / Self Care | Payer: BLUE CROSS/BLUE SHIELD | Attending: Internal Medicine | Admitting: Internal Medicine

## 2017-06-29 DIAGNOSIS — E876 Hypokalemia: Secondary | ICD-10-CM | POA: Diagnosis not present

## 2017-06-29 DIAGNOSIS — D649 Anemia, unspecified: Secondary | ICD-10-CM | POA: Diagnosis not present

## 2017-06-29 DIAGNOSIS — Z91013 Allergy to seafood: Secondary | ICD-10-CM | POA: Insufficient documentation

## 2017-06-29 DIAGNOSIS — Z79899 Other long term (current) drug therapy: Secondary | ICD-10-CM | POA: Insufficient documentation

## 2017-06-29 DIAGNOSIS — K219 Gastro-esophageal reflux disease without esophagitis: Secondary | ICD-10-CM | POA: Insufficient documentation

## 2017-06-29 DIAGNOSIS — J4551 Severe persistent asthma with (acute) exacerbation: Secondary | ICD-10-CM | POA: Diagnosis not present

## 2017-06-29 DIAGNOSIS — J45909 Unspecified asthma, uncomplicated: Secondary | ICD-10-CM | POA: Diagnosis present

## 2017-06-29 DIAGNOSIS — J9600 Acute respiratory failure, unspecified whether with hypoxia or hypercapnia: Secondary | ICD-10-CM | POA: Insufficient documentation

## 2017-06-29 DIAGNOSIS — J45901 Unspecified asthma with (acute) exacerbation: Secondary | ICD-10-CM | POA: Diagnosis present

## 2017-06-29 DIAGNOSIS — J9601 Acute respiratory failure with hypoxia: Secondary | ICD-10-CM | POA: Insufficient documentation

## 2017-06-29 LAB — CBC
HEMATOCRIT: 37.4 % — AB (ref 39.0–52.0)
HEMOGLOBIN: 11.9 g/dL — AB (ref 13.0–17.0)
MCH: 27.1 pg (ref 26.0–34.0)
MCHC: 31.8 g/dL (ref 30.0–36.0)
MCV: 85.2 fL (ref 78.0–100.0)
Platelets: 194 10*3/uL (ref 150–400)
RBC: 4.39 MIL/uL (ref 4.22–5.81)
RDW: 14.6 % (ref 11.5–15.5)
WBC: 8.5 10*3/uL (ref 4.0–10.5)

## 2017-06-29 LAB — BASIC METABOLIC PANEL
Anion gap: 6 (ref 5–15)
BUN: 22 mg/dL — AB (ref 6–20)
CHLORIDE: 113 mmol/L — AB (ref 101–111)
CO2: 19 mmol/L — AB (ref 22–32)
CREATININE: 0.9 mg/dL (ref 0.61–1.24)
Calcium: 7.5 mg/dL — ABNORMAL LOW (ref 8.9–10.3)
GFR calc non Af Amer: >60 mL/min (ref >60)
GLUCOSE: 83 mg/dL (ref 65–99)
Potassium: 3.4 mmol/L — ABNORMAL LOW (ref 3.5–5.1)
Sodium: 138 mmol/L (ref 135–145)

## 2017-06-29 MED ORDER — METHYLPREDNISOLONE SODIUM SUCC 125 MG IJ SOLR
125.0000 mg | Freq: Once | INTRAMUSCULAR | Status: AC
  Administered 2017-06-29: 125 mg via INTRAVENOUS
  Filled 2017-06-29: qty 2

## 2017-06-29 MED ORDER — MAGNESIUM SULFATE IN D5W 1-5 GM/100ML-% IV SOLN
1.0000 g | Freq: Once | INTRAVENOUS | Status: AC
  Administered 2017-06-29: 1 g via INTRAVENOUS
  Filled 2017-06-29: qty 100

## 2017-06-29 MED ORDER — ALBUTEROL (5 MG/ML) CONTINUOUS INHALATION SOLN
10.0000 mg/h | INHALATION_SOLUTION | Freq: Once | RESPIRATORY_TRACT | Status: AC
  Administered 2017-06-29: 10 mg/h via RESPIRATORY_TRACT
  Filled 2017-06-29: qty 20

## 2017-06-29 MED ORDER — ALBUTEROL SULFATE (2.5 MG/3ML) 0.083% IN NEBU
INHALATION_SOLUTION | RESPIRATORY_TRACT | Status: AC
  Administered 2017-06-29: 5 mg
  Filled 2017-06-29: qty 6

## 2017-06-29 MED ORDER — SODIUM CHLORIDE 0.9 % IV BOLUS (SEPSIS)
1000.0000 mL | Freq: Once | INTRAVENOUS | Status: AC
  Administered 2017-06-29: 1000 mL via INTRAVENOUS

## 2017-06-29 NOTE — ED Triage Notes (Signed)
C/o asthma attack that started this evening.  Pt out of inhaler.  Speaking in short phrases.  Nurse first started breathing treatment on arrival.

## 2017-06-29 NOTE — ED Provider Notes (Signed)
[facility] DEPT Provider Note   CSN: [id number] Arrival date & time: 06/29/17  2019     History   Chief Complaint Chief Complaint  Patient presents with  . Asthma    HPI Calvin Clay is a 46 y.o. male.  HPI  46 y.o. male with a hx of Asthma, presents to the Emergency Department today due to asthma attack this evening. Notes being out of medication x several weeks and has been using albuterol inhaler for relief intermittently. No CP/ABD pain. Notes shortness of breath and tightness in chest. No N/V. No diaphoresis. No fevers. No cough. Again no pain noted. No other symptoms noted.   Past Medical History:  Diagnosis Date  . Asthma     Patient Active Problem List   Diagnosis Date Noted  . GI bleed 09/16/2012  . Hematemesis 09/16/2012  . Hypotension 09/16/2012  . Migraine 09/16/2012  . 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 COSMETIC SURGERY    . ESOPHAGOGASTRODUODENOSCOPY  09/16/2012   Procedure: ESOPHAGOGASTRODUODENOSCOPY (EGD);  Surgeon: Ladene Artist, MD,FACG;  Location: [facility] ENDOSCOPY;  Service: Endoscopy;  Laterality: N/A;       Home Medications    Prior to Admission medications   Medication Sig Start Date End Date Taking? Authorizing Provider  albuterol (PROVENTIL HFA;VENTOLIN HFA) 108 (90 BASE) MCG/ACT inhaler Inhale 1 puff into the lungs 2 (two) times daily.    [provider]  HYDROcodone-acetaminophen (NORCO/VICODIN) 5-325 MG tablet Take 2 tablets by mouth 2 (two) times daily as needed for severe pain. 09/27/15   Everlene Balls, MD  mometasone-formoterol (DULERA) 100-5 MCG/ACT AERO Inhale 2 puffs into the lungs 2 (two) times daily.    [provider]  ranitidine (ZANTAC) 150 MG tablet Take 1 tablet (150 mg total) by mouth 2 (two) times daily. 09/16/16   Gloriann Loan, PA-C    Family History Family History  Problem Relation Age of Onset  . Colon cancer Mother   . Colon cancer Father      Social History Social History  Substance Use Topics  . Smoking status: Never Smoker  . Smokeless tobacco: Never Used  . Alcohol use Yes     Comment: occasional     Allergies   Iodine and Shrimp [shellfish allergy]   Review of Systems Review of Systems ROS reviewed and all are negative for acute change except as noted in the HPI.  Physical Exam Updated Vital Signs BP 115/74   Pulse 99   Temp (!) 97.5 F (36.4 C) (Oral)   Resp 16   Ht 6\' 3"  (1.905 m)   Wt 131.5 kg (290 lb)   SpO2 96%   BMI 36.25 kg/m   Physical Exam  Constitutional: He is oriented to person, place, and time. Vital signs are normal. He appears well-developed and well-nourished.  HENT:  Head: Normocephalic and atraumatic.  Right Ear: Hearing normal.  Left Ear: Hearing normal.  Eyes: Pupils are equal, round, and reactive to light. Conjunctivae and EOM are normal.  Neck: Normal range of motion. Neck supple.  Cardiovascular: Normal rate, regular rhythm, normal heart sounds and intact distal pulses.   Pulmonary/Chest: Effort normal. No accessory muscle usage. No tachypnea. No respiratory distress. He has wheezes in the right upper field, the right lower field, the left upper field and the left lower field.  Abdominal: Soft. There is no tenderness.  Musculoskeletal: Normal range of motion.  Neurological: He is alert and oriented to person, place,  and time.  Skin: Skin is warm and dry.  Psychiatric: He has a normal mood and affect. His speech is normal and behavior is normal. Thought content normal.  Nursing note and vitals reviewed.  ED Treatments / Results  Labs (all labs ordered are listed, but only abnormal results are displayed) Labs Reviewed  CBC - Abnormal; Notable for the following:       Result Value   Hemoglobin 11.9 (*)    HCT 37.4 (*)    All other components within normal limits  BASIC METABOLIC PANEL - Abnormal; Notable for the following:    Potassium 3.4 (*)    Chloride 113 (*)     CO2 19 (*)    BUN 22 (*)    Calcium 7.5 (*)    All other components within normal limits    EKG  EKG Interpretation None       Radiology Dg Chest Portable 1 View  Result Date: 06/29/2017 CLINICAL DATA:  Shortness of breath EXAM: PORTABLE CHEST 1 VIEW COMPARISON:  09/16/2016 FINDINGS: The heart size and mediastinal contours are within normal limits. Both lungs are clear. The visualized skeletal structures are unremarkable. IMPRESSION: No active disease. Electronically Signed   By: Donavan Foil M.D.   On: 06/29/2017 22:06    Procedures Procedures (including critical care time) CRITICAL CARE Performed by: Ozella Rocks   Total critical care time: 35 minutes  Critical care time was exclusive of separately billable procedures and treating other patients.  Critical care was necessary to treat or prevent imminent or life-threatening deterioration.  Critical care was time spent personally by me on the following activities: development of treatment plan with patient and/or surrogate as well as nursing, discussions with consultants, evaluation of patient's response to treatment, examination of patient, obtaining history from patient or surrogate, ordering and performing treatments and interventions, ordering and review of laboratory studies, ordering and review of radiographic studies, pulse oximetry and re-evaluation of patient's condition.   Medications Ordered in ED Medications  albuterol (PROVENTIL) (2.5 MG/3ML) 0.083% nebulizer solution (5 mg  Given 06/29/17 2027)  albuterol (PROVENTIL,VENTOLIN) solution continuous neb (10 mg/hr Nebulization Given 06/29/17 2224)  magnesium sulfate IVPB 1 g 100 mL (1 g Intravenous New Bag/Given 06/29/17 2244)  sodium chloride 0.9 % bolus 1,000 mL (1,000 mLs Intravenous New Bag/Given 06/29/17 2221)  methylPREDNISolone sodium succinate (SOLU-MEDROL) 125 mg/2 mL injection 125 mg (125 mg Intravenous Given 06/29/17 2221)     Initial Impression /  Assessment and Plan / ED Course  I have reviewed the triage vital signs and the nursing notes.  Pertinent labs & imaging results that were available during my care of the patient were reviewed by me and considered in my medical decision making (see chart for details).  Final Clinical Impressions(s) / ED Diagnoses  {I have reviewed and evaluated the relevant laboratory values. {I have reviewed and evaluated the relevant imaging studies.  {I have reviewed the relevant previous healthcare records.  {I obtained HPI from historian.   ED Course:  Assessment: Pt is a 46 y.o. male with hx Asthma who presents with Asthma exacerbation today. Out of medications x several weeks. No fevers. On exam, pt in NAD. Nontoxic/nonseptic appearing. VSS. Afebrile. Lungs with bilateral wheeze. Heart RRR. Abdomen nontender soft. CXR unremarkable. Given albuterol, steroids, and magensium in ED with minimal improvement. Plan is to Admit to medicine.    Disposition/Plan:  Admit Pt acknowledges and agrees with plan  Supervising Physician Charlesetta Shanks, MD  Final diagnoses:  Severe persistent asthma with exacerbation    New Prescriptions New Prescriptions   No medications on file     Shary Decamp, Hershal Coria 06/30/17 0036    Charlesetta Shanks, MD 07/04/17 [phone]

## 2017-06-29 NOTE — ED Provider Notes (Signed)
Medical screening examination/treatment/procedure(s) were conducted as a shared visit with non-physician practitioner(s) and myself.  I personally evaluated the patient during the encounter.   EKG Interpretation None     Patient reports that he did today just run out of his Proventil. He ran out of [facility] a couple weeks ago. He reports that his asthma has been increasing for a couple weeks. He reports however over the past couple of days it has gotten much worse and especially today. Patient denies fever. He reports occasional cough. No lower extremity swelling or calf pain. Patient is getting continuous neb as I examine him. Patient is alert and appropriate. No confusion or somnolence. He is speaking in full sentences. He has diffuse wheezes throughout the lung fields with diminished air flow to the bases. Patient is getting magnesium and continuous DuoNeb. He reports he has made significant improvement. He will be reassessed for response to treatment and determination for stability for discharge versus admission. I agree with plan of management.   Charlesetta Shanks, MD 06/29/17 2348

## 2017-06-30 ENCOUNTER — Encounter (HOSPITAL_COMMUNITY): Payer: Self-pay | Admitting: Internal Medicine

## 2017-06-30 DIAGNOSIS — E876 Hypokalemia: Secondary | ICD-10-CM

## 2017-06-30 DIAGNOSIS — J45901 Unspecified asthma with (acute) exacerbation: Secondary | ICD-10-CM | POA: Diagnosis present

## 2017-06-30 DIAGNOSIS — J9600 Acute respiratory failure, unspecified whether with hypoxia or hypercapnia: Secondary | ICD-10-CM | POA: Diagnosis not present

## 2017-06-30 DIAGNOSIS — J4551 Severe persistent asthma with (acute) exacerbation: Secondary | ICD-10-CM | POA: Diagnosis not present

## 2017-06-30 DIAGNOSIS — D649 Anemia, unspecified: Secondary | ICD-10-CM | POA: Diagnosis not present

## 2017-06-30 DIAGNOSIS — K219 Gastro-esophageal reflux disease without esophagitis: Secondary | ICD-10-CM | POA: Diagnosis not present

## 2017-06-30 LAB — BASIC METABOLIC PANEL
Anion gap: 12 (ref 5–15)
Anion gap: 10 (ref 5–15)
BUN: 26 mg/dL — ABNORMAL HIGH (ref 6–20)
BUN: 21 mg/dL — AB (ref 6–20)
CALCIUM: 9.2 mg/dL (ref 8.9–10.3)
CHLORIDE: 103 mmol/L (ref 101–111)
CHLORIDE: 106 mmol/L (ref 101–111)
CO2: 21 mmol/L — ABNORMAL LOW (ref 22–32)
CO2: 20 mmol/L — AB (ref 22–32)
CREATININE: 1.17 mg/dL (ref 0.61–1.24)
Calcium: 8.7 mg/dL — ABNORMAL LOW (ref 8.9–10.3)
Creatinine, Ser: 1.29 mg/dL — ABNORMAL HIGH (ref 0.61–1.24)
GFR calc non Af Amer: >60 mL/min (ref >60)
GFR calc non Af Amer: >60 mL/min (ref >60)
Glucose, Bld: 182 mg/dL — ABNORMAL HIGH (ref 65–99)
Glucose, Bld: 160 mg/dL — ABNORMAL HIGH (ref 65–99)
POTASSIUM: 3.1 mmol/L — AB (ref 3.5–5.1)
Potassium: 4.2 mmol/L (ref 3.5–5.1)
SODIUM: 136 mmol/L (ref 135–145)
Sodium: 136 mmol/L (ref 135–145)

## 2017-06-30 LAB — RETICULOCYTES
RBC.: 4.88 MIL/uL (ref 4.22–5.81)
RETIC CT PCT: 1.4 % (ref 0.4–3.1)
Retic Count, Absolute: 68.3 10*3/uL (ref 19.0–186.0)

## 2017-06-30 LAB — CBC
HEMATOCRIT: 40.8 % (ref 39.0–52.0)
HEMOGLOBIN: 13.3 g/dL (ref 13.0–17.0)
MCH: 27.3 pg (ref 26.0–34.0)
MCHC: 32.6 g/dL (ref 30.0–36.0)
MCV: 83.6 fL (ref 78.0–100.0)
Platelets: 279 10*3/uL (ref 150–400)
RBC: 4.88 MIL/uL (ref 4.22–5.81)
RDW: 14.5 % (ref 11.5–15.5)
WBC: 13.9 10*3/uL — AB (ref 4.0–10.5)

## 2017-06-30 LAB — VITAMIN B12: VITAMIN B 12: 348 pg/mL (ref 180–914)

## 2017-06-30 LAB — IRON AND TIBC
Iron: 26 ug/dL — ABNORMAL LOW (ref 45–182)
SATURATION RATIOS: 8 % — AB (ref 17.9–39.5)
TIBC: 311 ug/dL (ref 250–450)
UIBC: 285 ug/dL

## 2017-06-30 LAB — FERRITIN: FERRITIN: 125 ng/mL (ref 24–336)

## 2017-06-30 LAB — MAGNESIUM: MAGNESIUM: 2.1 mg/dL (ref 1.7–2.4)

## 2017-06-30 LAB — FOLATE: Folate: 9.9 ng/mL (ref >5.9)

## 2017-06-30 LAB — HIV ANTIBODY (ROUTINE TESTING W REFLEX): HIV Screen 4th Generation wRfx: NONREACTIVE

## 2017-06-30 MED ORDER — IPRATROPIUM-ALBUTEROL 0.5-2.5 (3) MG/3ML IN SOLN: 3.0000 mL | RESPIRATORY_TRACT | 1 refills | Status: DC | PRN

## 2017-06-30 MED ORDER — POTASSIUM CHLORIDE CRYS ER 20 MEQ PO TBCR
20.0000 meq | EXTENDED_RELEASE_TABLET | Freq: Once | ORAL | Status: AC
  Administered 2017-06-30: 20 meq via ORAL
  Filled 2017-06-30: qty 1

## 2017-06-30 MED ORDER — SODIUM CHLORIDE 0.9 % IV BOLUS (SEPSIS)
500.0000 mL | Freq: Once | INTRAVENOUS | Status: AC
  Administered 2017-06-30: 500 mL via INTRAVENOUS

## 2017-06-30 MED ORDER — ENOXAPARIN SODIUM 60 MG/0.6ML ~~LOC~~ SOLN
60.0000 mg | SUBCUTANEOUS | Status: DC
  Administered 2017-06-30: 14:00:00 60 mg via SUBCUTANEOUS
  Filled 2017-06-30: qty 0.6

## 2017-06-30 MED ORDER — METHYLPREDNISOLONE SODIUM SUCC 40 MG IJ SOLR
40.0000 mg | Freq: Two times a day (BID) | INTRAMUSCULAR | Status: DC
  Administered 2017-06-30 (×2): 40 mg via INTRAVENOUS
  Filled 2017-06-30 (×2): qty 1

## 2017-06-30 MED ORDER — ONDANSETRON HCL 4 MG/2ML IJ SOLN: 4.0000 mg | Freq: Four times a day (QID) | INTRAMUSCULAR | Status: DC | PRN

## 2017-06-30 MED ORDER — PREDNISONE 10 MG PO TABS: ORAL_TABLET | ORAL | 0 refills | Status: DC

## 2017-06-30 MED ORDER — ALBUTEROL SULFATE HFA 108 (90 BASE) MCG/ACT IN AERS: 1.0000 | INHALATION_SPRAY | Freq: Two times a day (BID) | RESPIRATORY_TRACT | 0 refills | Status: DC

## 2017-06-30 MED ORDER — BUDESONIDE 0.25 MG/2ML IN SUSP
0.2500 mg | Freq: Two times a day (BID) | RESPIRATORY_TRACT | Status: DC
  Administered 2017-06-30: 0.25 mg via RESPIRATORY_TRACT
  Filled 2017-06-30: qty 2

## 2017-06-30 MED ORDER — ALBUTEROL SULFATE (2.5 MG/3ML) 0.083% IN NEBU
2.5000 mg | INHALATION_SOLUTION | RESPIRATORY_TRACT | Status: DC
  Administered 2017-06-30 (×3): 2.5 mg via RESPIRATORY_TRACT
  Filled 2017-06-30 (×3): qty 3

## 2017-06-30 MED ORDER — ALBUTEROL SULFATE (2.5 MG/3ML) 0.083% IN NEBU: 2.5000 mg | INHALATION_SOLUTION | RESPIRATORY_TRACT | Status: DC | PRN

## 2017-06-30 MED ORDER — ONDANSETRON HCL 4 MG PO TABS: 4.0000 mg | ORAL_TABLET | Freq: Four times a day (QID) | ORAL | Status: DC | PRN

## 2017-06-30 MED ORDER — ALBUTEROL SULFATE (2.5 MG/3ML) 0.083% IN NEBU
2.5000 mg | INHALATION_SOLUTION | Freq: Three times a day (TID) | RESPIRATORY_TRACT | Status: DC
  Administered 2017-06-30: 2.5 mg via RESPIRATORY_TRACT
  Filled 2017-06-30: qty 3

## 2017-06-30 MED ORDER — MOMETASONE FURO-FORMOTEROL FUM 100-5 MCG/ACT IN AERO: 2.0000 | INHALATION_SPRAY | Freq: Two times a day (BID) | RESPIRATORY_TRACT | 0 refills | Status: DC

## 2017-06-30 MED ORDER — ACETAMINOPHEN 325 MG PO TABS
650.0000 mg | ORAL_TABLET | Freq: Four times a day (QID) | ORAL | Status: DC | PRN
Start: 2017-06-30 — End: 2017-06-30
  Administered 2017-06-30: 650 mg via ORAL
  Filled 2017-06-30: qty 2

## 2017-06-30 MED ORDER — POTASSIUM CHLORIDE CRYS ER 20 MEQ PO TBCR
40.0000 meq | EXTENDED_RELEASE_TABLET | Freq: Once | ORAL | Status: AC
  Administered 2017-06-30: 40 meq via ORAL
  Filled 2017-06-30: qty 2

## 2017-06-30 MED ORDER — ACETAMINOPHEN 650 MG RE SUPP: 650.0000 mg | Freq: Four times a day (QID) | RECTAL | Status: DC | PRN

## 2017-06-30 NOTE — Discharge Instructions (Signed)

## 2017-06-30 NOTE — H&P (Signed)
History and Physical    [name] TDD:[id number] DOB: [birth year] DOA: 06/29/2017  PCP: Everardo Beals, NP  Patient coming from: Home.  Chief Complaint: Shortness of breath.  HPI: Calvin Clay is a 46 y.o. male with history of asthma presents to the ER with complaints of shortness of breath. Patient states that he has been having chronic shortness of breath from asthma despite using his inhalers regularly. But over the last 24 hours it has acutely worsened. Denies any chest pain productive cough fever or chills.   ED Course: In the ER patient was found to be wheezing bilaterally and chest x-ray done was unremarkable. Despite giving nebulizer treatment and steroids patient was short of breath and is being admitted for further observation.  Review of Systems: As per HPI, rest all negative.   Past Medical History:  Diagnosis Date  . Asthma     Past Surgical History:  Procedure Laterality Date  . COSMETIC SURGERY    . ESOPHAGOGASTRODUODENOSCOPY  09/16/2012   Procedure: ESOPHAGOGASTRODUODENOSCOPY (EGD);  Surgeon: Ladene Artist, MD,FACG;  Location: [facility] ENDOSCOPY;  Service: Endoscopy;  Laterality: N/A;     reports that he has never smoked. He has never used smokeless tobacco. He reports that he drinks alcohol. He reports that he does not use drugs.  Allergies  Allergen Reactions  . Iodine Anaphylaxis  . Shrimp [Shellfish Allergy] Swelling    Throat closing    Family History  Problem Relation Age of Onset  . Colon cancer Mother   . Colon cancer Father     Prior to Admission medications   Medication Sig Start Date End Date Taking? Authorizing Provider  albuterol (PROVENTIL HFA;VENTOLIN HFA) 108 (90 BASE) MCG/ACT inhaler Inhale 1 puff into the lungs 2 (two) times daily.   Yes [provider]  mometasone-formoterol (DULERA) 100-5 MCG/ACT AERO Inhale 2 puffs into the lungs 2 (two) times daily.   Yes [provider]  HYDROcodone-acetaminophen  (NORCO/VICODIN) 5-325 MG tablet Take 2 tablets by mouth 2 (two) times daily as needed for severe pain. Patient not taking: Reported on 06/29/2017 09/27/15   Everlene Balls, MD  ranitidine (ZANTAC) 150 MG tablet Take 1 tablet (150 mg total) by mouth 2 (two) times daily. Patient not taking: Reported on 06/29/2017 09/16/16   Gloriann Loan, PA-C    Physical Exam: Vitals:   06/30/17 0100 06/30/17 0115 06/30/17 0130 06/30/17 0206  BP: 136/65 (!) 118/56  134/65  Pulse: (!) 127 (!) 117 (!) 116 (!) 120  Resp: 15 13  18   Temp:    97.8 F (36.6 C)  TempSrc:    Oral  SpO2: 96% 93% 95% 98%  Weight:    131 kg (288 lb 11.2 oz)  Height:    6\' 3"  (1.905 m)      Constitutional: Moderately built and nourished. Vitals:   06/30/17 0100 06/30/17 0115 06/30/17 0130 06/30/17 0206  BP: 136/65 (!) 118/56  134/65  Pulse: (!) 127 (!) 117 (!) 116 (!) 120  Resp: 15 13  18   Temp:    97.8 F (36.6 C)  TempSrc:    Oral  SpO2: 96% 93% 95% 98%  Weight:    131 kg (288 lb 11.2 oz)  Height:    6\' 3"  (1.905 m)   Eyes: Anicteric no pallor. ENMT: No discharge from the ears eyes nose and mouth. Neck: No JVD appreciated no mass felt. Respiratory: Bilateral expiratory wheeze heard. No crepitations. Cardiovascular: S1 and S2 heard tachycardic. Abdomen: Soft nontender bowel sounds  present. No guarding or rigidity. Musculoskeletal: No edema. No joint effusion. Skin: No rash. Skin appears warm. Neurologic: Alert awake oriented to time place and person. Moves all extremities. Psychiatric: Appears normal. Normal affect.   Labs on Admission: I have personally reviewed following labs and imaging studies  CBC:  Recent Labs Lab 06/29/17 2214  WBC 8.5  HGB 11.9*  HCT 37.4*  MCV 85.2  PLT 833   Basic Metabolic Panel:  Recent Labs Lab 06/29/17 2214  NA 138  K 3.4*  CL 113*  CO2 19*  GLUCOSE 83  BUN 22*  CREATININE 0.90  CALCIUM 7.5*   GFR: Estimated Creatinine Clearance: 152.7 mL/min (by C-G formula  based on SCr of 0.9 mg/dL). Liver Function Tests: No results for input(s): AST, ALT, ALKPHOS, BILITOT, PROT, ALBUMIN in the last 168 hours. No results for input(s): LIPASE, AMYLASE in the last 168 hours. No results for input(s): AMMONIA in the last 168 hours. Coagulation Profile: No results for input(s): INR, PROTIME in the last 168 hours. Cardiac Enzymes: No results for input(s): CKTOTAL, CKMB, CKMBINDEX, TROPONINI in the last 168 hours. BNP (last 3 results) No results for input(s): PROBNP in the last 8760 hours. HbA1C: No results for input(s): HGBA1C in the last 72 hours. CBG: No results for input(s): GLUCAP in the last 168 hours. Lipid Profile: No results for input(s): CHOL, HDL, LDLCALC, TRIG, CHOLHDL, LDLDIRECT in the last 72 hours. Thyroid Function Tests: No results for input(s): TSH, T4TOTAL, FREET4, T3FREE, THYROIDAB in the last 72 hours. Anemia Panel: No results for input(s): VITAMINB12, FOLATE, FERRITIN, TIBC, IRON, RETICCTPCT in the last 72 hours. Urine analysis:    Component Value Date/Time   COLORURINE AMBER (A) 09/27/2015 0257   APPEARANCEUR CLOUDY (A) 09/27/2015 0257   LABSPEC 1.022 09/27/2015 0257   PHURINE 6.0 09/27/2015 0257   GLUCOSEU NEGATIVE 09/27/2015 0257   HGBUR LARGE (A) 09/27/2015 0257   BILIRUBINUR NEGATIVE 09/27/2015 0257   KETONESUR 15 (A) 09/27/2015 0257   PROTEINUR NEGATIVE 09/27/2015 0257   UROBILINOGEN 1.0 09/27/2015 0257   NITRITE NEGATIVE 09/27/2015 0257   LEUKOCYTESUR NEGATIVE 09/27/2015 0257   Sepsis Labs: @LABRCNTIP (procalcitonin:4,lacticidven:4) )No results found for this or any previous visit (from the past 240 hour(s)).   Radiological Exams on Admission: Dg Chest Portable 1 View  Result Date: 06/29/2017 CLINICAL DATA:  Shortness of breath EXAM: PORTABLE CHEST 1 VIEW COMPARISON:  09/16/2016 FINDINGS: The heart size and mediastinal contours are within normal limits. Both lungs are clear. The visualized skeletal structures are  unremarkable. IMPRESSION: No active disease. Electronically Signed   By: Donavan Foil M.D.   On: 06/29/2017 22:06     Assessment/Plan Principal Problem:   Asthma exacerbation    1. Acute respiratory failure with hypoxia secondary to asthma exacerbation - I have placed patient on IV Solu-Medrol Pulmicort and nebulizer. Closely follow respiratory status. 2. Anemia normocytic normochromic - check anemia panel. 3. History of GERD.   DVT prophylaxis: Lovenox. Code Status: Full code.  Family Communication: Discussed with patient.  Disposition Plan: Home.  Consults called: None.  Admission status: Observation.    Rise Patience MD [facility] Pager [phone].  If 7PM-7AM, please contact night-coverage [email or web address] Password [email or web address]  06/30/2017, 2:21 AM

## 2017-06-30 NOTE — Progress Notes (Addendum)
Pt's post hospital follow up scheduled for 07/05/2017 at 2:30pm at the [facility] with Freeman Caldron NP. CM made pt aware and provided information to pt. Whitman Hero RN,BSN,CM

## 2017-06-30 NOTE — Discharge Summary (Signed)
Physician Discharge Summary  Calvin Clay QVZ:[id number] DOB: [birth year] DOA: 06/29/2017  PCP: Everardo Beals, NP  Admit date: 06/29/2017 Discharge date: 06/30/2017  Time spent: 45 minutes  Recommendations for Outpatient Follow-up:  Patient will be discharged to home.  Patient will need to follow up with primary care provider within one week of discharge.  Patient should continue medications as prescribed.  Patient should follow a regular diet.   Discharge Diagnoses:  Acute respiratory failure with hypoxia secondary to asthma exacerbation Normocytic Anemia  GERD Hypokalemia  Discharge Condition: Stable  Diet recommendation: regular  Filed Weights   06/29/17 2028 06/30/17 0206  Weight: 131.5 kg (290 lb) 131 kg (288 lb 11.2 oz)    History of present illness:  On 06/30/2017 by Dr. Gean Birchwood Calvin Clay is a 46 y.o. male with history of asthma presents to the ER with complaints of shortness of breath. Patient states that he has been having chronic shortness of breath from asthma despite using his inhalers regularly. But over the last 24 hours it has acutely worsened. Denies any chest pain productive cough fever or chills.  Hospital Course:  Acute respiratory failure with hypoxia secondary to asthma exacerbation -Patient initially placed on IV Solu-Medrol, Pulmicort and nebulizer -He recently ran out his home medications including pro-air, Dulera, Proventil inhalers. He was not able to see his primary care physician for refills due to loss of insurance coverage -Patient has improved rapidly -Will discharge patient with prednisone taper, refills on Dulera/Pro-air, nebulizer/nebs -Have discussed with case management regarding patient set up with the [facility]  Normocytic Anemia  -hemoglobin on admission was 11.9, currently 13.3 which is his baseline  GERD -Continue zantac  Hypokalemia -Resolved  Procedures: none  Consultations: none  Discharge  Exam: Vitals:   06/30/17 0206 06/30/17 0528  BP: 134/65 (!) 126/58  Pulse: (!) 120 (!) 114  Resp: 18 18  Temp: 97.8 F (36.6 C) 98.5 F (36.9 C)   Patient states he's feeling much better today. Would like to go home. Feels his breathing has improved and he is no longer having both wheezing inspiratory and expiratory. Denies chest pain, dizziness, headache, abdominal pain, nausea, vomiting, diarrhea or constipation.   General: Well developed, well nourished, NAD, appears stated age  HEENT: NCAT, mucous membranes moist.  Cardiovascular: S1 S2 auscultated, no rubs, murmurs or gallops. Regular rate and rhythm.  Respiratory: End expiratory wheezing, occ cough  Abdomen: Soft, nontender, nondistended, + bowel sounds  Extremities: warm dry without cyanosis clubbing or edema  Neuro: AAOx3, cranial nerves grossly intact. Strength 5/5 in patient's upper and lower extremities bilaterally  Skin: Without rashes exudates or nodules  Psych: Normal affect and demeanor with intact judgement and insight  Discharge Instructions Discharge Instructions    DME Nebulizer machine    Complete by:  As directed    Patient needs a nebulizer to treat with the following condition:  Asthma exacerbation   DME Nebulizer/meds    Complete by:  As directed    Patient needs a nebulizer to treat with the following condition:  Asthma exacerbation   Discharge instructions    Complete by:  As directed    Patient will be discharged to home.  Patient will need to follow up with primary care provider within one week of discharge.  Patient should continue medications as prescribed.  Patient should follow a regular diet.     Current Discharge Medication List    START taking these medications   Details  ipratropium-albuterol (DUONEB)  0.5-2.5 (3) MG/3ML SOLN Take 3 mLs by nebulization every 4 (four) hours as needed (wheezing or shortness of breath). Qty: 360 mL, Refills: 1    predniSONE (DELTASONE) 10 MG tablet  Take 4 tablets daily x 2 days, then 3 tablets daily x 2 days, then 2 tabs x 2 days, then 1 tab daily x 2 days. Qty: 20 tablet, Refills: 0      CONTINUE these medications which have CHANGED   Details  albuterol (PROVENTIL HFA;VENTOLIN HFA) 108 (90 Base) MCG/ACT inhaler Inhale 1 puff into the lungs 2 (two) times daily. Qty: 18 g, Refills: 0    mometasone-formoterol (DULERA) 100-5 MCG/ACT AERO Inhale 2 puffs into the lungs 2 (two) times daily. Qty: 13 g, Refills: 0      CONTINUE these medications which have NOT CHANGED   Details  ranitidine (ZANTAC) 150 MG tablet Take 1 tablet (150 mg total) by mouth 2 (two) times daily. Qty: 60 tablet, Refills: 0      STOP taking these medications     HYDROcodone-acetaminophen (NORCO/VICODIN) 5-325 MG tablet        Allergies  Allergen Reactions  . Iodine Anaphylaxis  . Shrimp [Shellfish Allergy] Swelling    Throat closing   Follow-up Information    [facility]. Go on 07/05/2017.   Why:  post hospital follow up scheduled for 07/05/2017 at 2:30pm at the [facility] with Freeman Caldron NP Contact information: [address] [place] [address] [phone]           The results of significant diagnostics from this hospitalization (including imaging, microbiology, ancillary and laboratory) are listed below for reference.    Significant Diagnostic Studies: Dg Chest Portable 1 View  Result Date: 06/29/2017 CLINICAL DATA:  Shortness of breath EXAM: PORTABLE CHEST 1 VIEW COMPARISON:  09/16/2016 FINDINGS: The heart size and mediastinal contours are within normal limits. Both lungs are clear. The visualized skeletal structures are unremarkable. IMPRESSION: No active disease. Electronically Signed   By: Donavan Foil M.D.   On: 06/29/2017 22:06    Microbiology: No results found for this or any previous visit (from the past 240 hour(s)).   Labs: Basic Metabolic  Panel:  Recent Labs Lab 06/29/17 2214 06/30/17 0251 06/30/17 1106  NA 138 136 136  K 3.4* 3.1* 4.2  CL 113* 103 106  CO2 19* 21* 20*  GLUCOSE 83 182* 160*  BUN 22* 26* 21*  CREATININE 0.90 1.29* 1.17  CALCIUM 7.5* 8.7* 9.2  MG  --  2.1  --    Liver Function Tests: No results for input(s): AST, ALT, ALKPHOS, BILITOT, PROT, ALBUMIN in the last 168 hours. No results for input(s): LIPASE, AMYLASE in the last 168 hours. No results for input(s): AMMONIA in the last 168 hours. CBC:  Recent Labs Lab 06/29/17 2214 06/30/17 0251  WBC 8.5 13.9*  HGB 11.9* 13.3  HCT 37.4* 40.8  MCV 85.2 83.6  PLT 194 279   Cardiac Enzymes: No results for input(s): CKTOTAL, CKMB, CKMBINDEX, TROPONINI in the last 168 hours. BNP: BNP (last 3 results) No results for input(s): BNP in the last 8760 hours.  ProBNP (last 3 results) No results for input(s): PROBNP in the last 8760 hours.  CBG: No results for input(s): GLUCAP in the last 168 hours.     SignedCristal Ford  [facility] 06/30/2017, 2:39 PM

## 2017-07-01 NOTE — Progress Notes (Signed)
Calvin Clay to be D/C'd Home per MD order.  Discussed with the patient and all questions fully answered.  VSS, Skin clean, dry and intact without evidence of skin break down, no evidence of skin tears noted. IV catheter discontinued intact. Site without signs and symptoms of complications. Dressing and pressure applied.  An After Visit Summary was printed and given to the patient. Patient received prescription.  D/c education completed with patient/family including follow up instructions, medication list, d/c activities limitations if indicated, with other d/c instructions as indicated by MD - patient able to verbalize understanding, all questions fully answered.   Patient instructed to return to ED, call 911, or call MD for any changes in condition.   Patient escorted via [place], and D/C home via private auto.  Dorris Carnes 07/01/2017 5:27 PM

## 2017-07-05 ENCOUNTER — Inpatient Hospital Stay: Payer: BLUE CROSS/BLUE SHIELD

## 2017-07-13 ENCOUNTER — Inpatient Hospital Stay: Payer: BLUE CROSS/BLUE SHIELD | Admitting: Critical Care Medicine

## 2017-07-27 ENCOUNTER — Ambulatory Visit: Payer: Self-pay | Attending: Critical Care Medicine | Admitting: Critical Care Medicine

## 2017-07-27 ENCOUNTER — Encounter: Payer: Self-pay | Admitting: Critical Care Medicine

## 2017-07-27 VITALS — BP 114/74 | HR 79 | Temp 98.1°F | Wt 295.6 lb

## 2017-07-27 DIAGNOSIS — J4550 Severe persistent asthma, uncomplicated: Secondary | ICD-10-CM

## 2017-07-27 DIAGNOSIS — Z8701 Personal history of pneumonia (recurrent): Secondary | ICD-10-CM | POA: Insufficient documentation

## 2017-07-27 DIAGNOSIS — R0789 Other chest pain: Secondary | ICD-10-CM | POA: Insufficient documentation

## 2017-07-27 DIAGNOSIS — K219 Gastro-esophageal reflux disease without esophagitis: Secondary | ICD-10-CM | POA: Insufficient documentation

## 2017-07-27 DIAGNOSIS — R251 Tremor, unspecified: Secondary | ICD-10-CM | POA: Insufficient documentation

## 2017-07-27 DIAGNOSIS — J4551 Severe persistent asthma with (acute) exacerbation: Secondary | ICD-10-CM

## 2017-07-27 MED ORDER — ALBUTEROL SULFATE HFA 108 (90 BASE) MCG/ACT IN AERS: 2.0000 | INHALATION_SPRAY | Freq: Four times a day (QID) | RESPIRATORY_TRACT | 2 refills | Status: AC | PRN

## 2017-07-27 MED ORDER — BUDESONIDE-FORMOTEROL FUMARATE 160-4.5 MCG/ACT IN AERO: 2.0000 | INHALATION_SPRAY | Freq: Two times a day (BID) | RESPIRATORY_TRACT | 11 refills | Status: AC

## 2017-07-27 NOTE — Patient Instructions (Signed)
Stay on symbicort 160 two puff twice daily Change to albuterol inhaler 2puff every 4-6 hours as needed (new Rx sent) No other changes Use ibuprofen for pain as needed Lung function test at [facility]

## 2017-07-27 NOTE — Assessment & Plan Note (Signed)
Severe persistent asthma with GERD and environmental factors as ppt Plan GERD diet See asthma exac assessment

## 2017-07-27 NOTE — Assessment & Plan Note (Addendum)
Severe persistent asthma with recent flare Plan Change to symbicort 160 two puff bid Prn albuterol No further steroids Obtain pfts

## 2017-07-27 NOTE — Progress Notes (Signed)
Subjective:    Patient ID: Calvin Clay, male    DOB: 03-Nov-1971, 46 y.o.   MRN: [id number]  46 y.o.M with severe asthma attack.  Rx dulera, albuterol, prednisone pulse. This was restarted.  Issue with weather and stress.  Since d/c : saw PCP:  Not able to access [facility] d/t insurance issues.  Symbicort is working and was given samples.  Used Proair respiclk with coupon but prefers the aerosol Hx of asthma lifelong.  Pt used pt assistance in the past.  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     Asthma  He complains of chest tightness, difficulty breathing, shortness of breath and wheezing. There is no cough, frequent throat clearing, hemoptysis, hoarse voice or sputum production. Primary symptoms comments: Notes right sided chest pain. This is a recurrent problem. The current episode started more than 1 year ago. The problem occurs constantly. The problem has been rapidly improving. Associated symptoms include chest pain, dyspnea on exertion, headaches, heartburn and nasal congestion. Pertinent negatives include no ear congestion, ear pain, orthopnea, postnasal drip, rhinorrhea, sneezing, sore throat or trouble swallowing. His symptoms are aggravated by exposure to fumes, exposure to smoke, change in weather, strenuous activity and emotional stress. His symptoms are alleviated by beta-agonist, steroid inhaler and oral steroids. He reports significant improvement on treatment. His past medical history is significant for asthma and pneumonia.   Past Medical History:  Diagnosis Date  . Asthma   . GERD (gastroesophageal reflux disease)   . Migraine      Family History  Problem Relation Age of Onset  . Colon cancer Mother   . Colon cancer Father      Social History   Social History  . Marital status: Married    Spouse name: N/A  . Number of children: N/A  . Years of education: N/A   Occupational History  . Not on file.   Social History  Main Topics  . Smoking status: Never Smoker  . Smokeless tobacco: Never Used  . Alcohol use Yes     Comment: occasional  . Drug use: No  . Sexual activity: Not on file   Other Topics Concern  . Not on file   Social History Narrative  . No narrative on file     Allergies  Allergen Reactions  . Iodine Anaphylaxis  . Shrimp [Shellfish Allergy] Swelling    Throat closing     Outpatient Medications Prior to Visit  Medication Sig Dispense Refill  . albuterol (PROVENTIL HFA;VENTOLIN HFA) 108 (90 Base) MCG/ACT inhaler Inhale 1 puff into the lungs 2 (two) times daily. 18 g 0  . ipratropium-albuterol (DUONEB) 0.5-2.5 (3) MG/3ML SOLN Take 3 mLs by nebulization every 4 (four) hours as needed (wheezing or shortness of breath). (Patient not taking: Reported on 07/27/2017) 360 mL 1  . mometasone-formoterol (DULERA) 100-5 MCG/ACT AERO Inhale 2 puffs into the lungs 2 (two) times daily. (Patient not taking: Reported on 07/27/2017) 13 g 0  . predniSONE (DELTASONE) 10 MG tablet Take 4 tablets daily x 2 days, then 3 tablets daily x 2 days, then 2 tabs x 2 days, then 1 tab daily x 2 days. (Patient not taking: Reported on 07/27/2017) 20 tablet 0  . ranitidine (ZANTAC) 150 MG tablet Take 1 tablet (150 mg total) by mouth 2 (two) times daily. (Patient not taking: Reported on 06/29/2017) 60 tablet 0   No facility-administered medications prior to visit.  Review of Systems  HENT: Negative for ear pain, hoarse voice, postnasal drip, rhinorrhea, sneezing, sore throat and trouble swallowing.   Respiratory: Positive for shortness of breath and wheezing. Negative for cough, hemoptysis and sputum production.   Cardiovascular: Positive for chest pain and dyspnea on exertion.  Gastrointestinal: Positive for heartburn.  Neurological: Positive for headaches.       Objective:   Physical Exam  Vitals:   07/27/17 1114  BP: 114/74  Pulse: 79  Temp: 98.1 F (36.7 C)  TempSrc: Oral  SpO2: 97%  Weight:  295 lb 9.6 oz (134.1 kg)    Gen: Pleasant, well-nourished, in no distress,  normal affect  ENT: No lesions,  mouth clear,  oropharynx clear, no postnasal drip  Neck: No JVD, no TMG, no carotid bruits  Lungs: No use of accessory muscles, no dullness to percussion, clear without rales or rhonchi  Cardiovascular: RRR, heart sounds normal, no murmur or gallops, no peripheral edema  Abdomen: soft and NT, no HSM,  BS normal  Musculoskeletal: No deformities, no cyanosis or clubbing  Neuro: alert, non focal  Skin: Warm, no lesions or rashes  No results found.  CXR: NAD 06/2017 All labs reviewed     Assessment & Plan:  I personally reviewed all images and lab data in the [facility] system as well as any outside material available during this office visit and agree with the  radiology impressions.   Asthma exacerbation Severe persistent asthma with recent flare Plan Change to symbicort 160 two puff bid Prn albuterol No further steroids Obtain pfts   Severe persistent asthma without complication Severe persistent asthma with GERD and environmental factors as ppt Plan GERD diet See asthma exac assessment   [name] was seen today for asthma.  Diagnoses and all orders for this visit:  Severe persistent asthma without complication -     Pulmonary Function Test; Future  Severe persistent asthma with exacerbation  Other orders -     budesonide-formoterol (SYMBICORT) 160-4.5 MCG/ACT inhaler; Inhale 2 puffs into the lungs 2 (two) times daily. -     albuterol (PROVENTIL HFA;VENTOLIN HFA) 108 (90 Base) MCG/ACT inhaler; Inhale 2 puffs into the lungs every 6 (six) hours as needed for wheezing or shortness of breath.

## 2017-08-23 ENCOUNTER — Observation Stay (HOSPITAL_COMMUNITY)
Admission: EM | Admit: 2017-08-23 | Discharge: 2017-08-24 | Disposition: A | Discharge status: Home / Self Care | Payer: Self-pay | Attending: Internal Medicine | Admitting: Internal Medicine

## 2017-08-23 ENCOUNTER — Emergency Department (HOSPITAL_COMMUNITY): Payer: Self-pay

## 2017-08-23 ENCOUNTER — Encounter (HOSPITAL_COMMUNITY): Payer: Self-pay | Admitting: *Deleted

## 2017-08-23 DIAGNOSIS — R42 Dizziness and giddiness: Secondary | ICD-10-CM | POA: Insufficient documentation

## 2017-08-23 DIAGNOSIS — J4550 Severe persistent asthma, uncomplicated: Secondary | ICD-10-CM | POA: Insufficient documentation

## 2017-08-23 DIAGNOSIS — Z91013 Allergy to seafood: Secondary | ICD-10-CM | POA: Insufficient documentation

## 2017-08-23 DIAGNOSIS — R072 Precordial pain: Principal | ICD-10-CM | POA: Insufficient documentation

## 2017-08-23 DIAGNOSIS — Z6835 Body mass index (BMI) 35.0-35.9, adult: Secondary | ICD-10-CM | POA: Insufficient documentation

## 2017-08-23 DIAGNOSIS — R251 Tremor, unspecified: Secondary | ICD-10-CM | POA: Insufficient documentation

## 2017-08-23 DIAGNOSIS — Z91041 Radiographic dye allergy status: Secondary | ICD-10-CM | POA: Insufficient documentation

## 2017-08-23 DIAGNOSIS — Z79899 Other long term (current) drug therapy: Secondary | ICD-10-CM | POA: Insufficient documentation

## 2017-08-23 DIAGNOSIS — R079 Chest pain, unspecified: Secondary | ICD-10-CM | POA: Diagnosis present

## 2017-08-23 DIAGNOSIS — R0602 Shortness of breath: Secondary | ICD-10-CM | POA: Insufficient documentation

## 2017-08-23 DIAGNOSIS — R110 Nausea: Secondary | ICD-10-CM | POA: Insufficient documentation

## 2017-08-23 DIAGNOSIS — K219 Gastro-esophageal reflux disease without esophagitis: Secondary | ICD-10-CM | POA: Insufficient documentation

## 2017-08-23 LAB — CBC
HCT: 44.4 % (ref 39.0–52.0)
HEMOGLOBIN: 14.4 g/dL (ref 13.0–17.0)
MCH: 27.6 pg (ref 26.0–34.0)
MCHC: 32.4 g/dL (ref 30.0–36.0)
MCV: 85.1 fL (ref 78.0–100.0)
PLATELETS: 261 10*3/uL (ref 150–400)
RBC: 5.22 MIL/uL (ref 4.22–5.81)
RDW: 14.6 % (ref 11.5–15.5)
WBC: 9.4 10*3/uL (ref 4.0–10.5)

## 2017-08-23 LAB — I-STAT TROPONIN, ED
TROPONIN I, POC: 0 ng/mL (ref 0.00–0.08)
Troponin i, poc: 0 ng/mL (ref 0.00–0.08)

## 2017-08-23 LAB — BASIC METABOLIC PANEL
ANION GAP: 7 (ref 5–15)
BUN: 10 mg/dL (ref 6–20)
CHLORIDE: 102 mmol/L (ref 101–111)
CO2: 28 mmol/L (ref 22–32)
CREATININE: 1.06 mg/dL (ref 0.61–1.24)
Calcium: 9.3 mg/dL (ref 8.9–10.3)
GFR calc non Af Amer: >60 mL/min (ref >60)
Glucose, Bld: 96 mg/dL (ref 65–99)
Potassium: 4 mmol/L (ref 3.5–5.1)
SODIUM: 137 mmol/L (ref 135–145)

## 2017-08-23 LAB — HEPATIC FUNCTION PANEL
ALT: 31 U/L (ref 17–63)
AST: 26 U/L (ref 15–41)
Albumin: 3.8 g/dL (ref 3.5–5.0)
Alkaline Phosphatase: 75 U/L (ref 38–126)
BILIRUBIN TOTAL: 0.7 mg/dL (ref 0.3–1.2)
Bilirubin, Direct: 0.1 mg/dL (ref 0.1–0.5)
Indirect Bilirubin: 0.6 mg/dL (ref 0.3–0.9)
TOTAL PROTEIN: 7.3 g/dL (ref 6.5–8.1)

## 2017-08-23 LAB — PROTIME-INR
INR: 0.97
Prothrombin Time: 12.8 seconds (ref 11.4–15.2)

## 2017-08-23 LAB — D-DIMER, QUANTITATIVE (NOT AT ARMC): D DIMER QUANT: 0.34 ug{FEU}/mL (ref 0.00–0.50)

## 2017-08-23 MED ORDER — ENOXAPARIN SODIUM 40 MG/0.4ML ~~LOC~~ SOLN
40.0000 mg | Freq: Every day | SUBCUTANEOUS | Status: DC
  Administered 2017-08-24: 40 mg via SUBCUTANEOUS
  Filled 2017-08-23 (×2): qty 0.4

## 2017-08-23 MED ORDER — ONDANSETRON HCL 4 MG/2ML IJ SOLN: 4.0000 mg | Freq: Four times a day (QID) | INTRAMUSCULAR | Status: DC | PRN

## 2017-08-23 MED ORDER — ASPIRIN 81 MG PO CHEW
324.0000 mg | CHEWABLE_TABLET | Freq: Once | ORAL | Status: AC
  Administered 2017-08-23: 324 mg via ORAL
  Filled 2017-08-23: qty 4

## 2017-08-23 MED ORDER — ACETAMINOPHEN 325 MG PO TABS
650.0000 mg | ORAL_TABLET | ORAL | Status: DC | PRN
  Administered 2017-08-24: 650 mg via ORAL
  Filled 2017-08-23: qty 2

## 2017-08-23 NOTE — H&P (Signed)
History and Physical    Calvin Clay NWG:[id number] DOB: Jan 18, 1971 DOA: 08/23/2017  PCP: Everardo Beals, NP  Patient coming from: Home  I have personally briefly reviewed patient's old medical records in [place]  Chief Complaint: Chest pain  HPI: Calvin Clay is a 46 y.o. male with medical history significant of Asthma.  Patient presents to the ED for evaluation of CP and dizziness.  Symptoms onset yesterday while at work.  CP, SOB, nausea.  Symptoms worse with exertion, better at rest.  Radiates to left jaw, ear, and left neck.  8/10 in severity at worst.   ED Course: S1QT3 on EKG but this appears old.   Review of Systems: As per HPI otherwise 10 point review of systems negative.   Past Medical History:  Diagnosis Date  . Asthma   . GERD (gastroesophageal reflux disease)   . Migraine     Past Surgical History:  Procedure Laterality Date  . COSMETIC SURGERY    . ESOPHAGOGASTRODUODENOSCOPY  09/16/2012   Procedure: ESOPHAGOGASTRODUODENOSCOPY (EGD);  Surgeon: Ladene Artist, MD,FACG;  [facility];  Service: Endoscopy;  Laterality: N/A;     reports that he has never smoked. He has never used smokeless tobacco. He reports that he drinks alcohol. He reports that he does not use drugs.  Allergies  Allergen Reactions  . Iodine Anaphylaxis  . Shrimp [Shellfish Allergy] Swelling    Throat closing    Family History  Problem Relation Age of Onset  . Colon cancer Mother   . Colon cancer Father      Prior to Admission medications   Medication Sig Start Date End Date Taking? Authorizing Provider  albuterol (PROVENTIL HFA;VENTOLIN HFA) 108 (90 Base) MCG/ACT inhaler Inhale 2 puffs into the lungs every 6 (six) hours as needed for wheezing or shortness of breath. 07/27/17  Yes Elsie Stain, MD  Aspirin-Salicylamide-Caffeine (BC HEADACHE POWDER PO) Take 1 Package by mouth 3 (three) times daily as needed (headache).   Yes [provider]    budesonide-formoterol (SYMBICORT) 160-4.5 MCG/ACT inhaler Inhale 2 puffs into the lungs 2 (two) times daily. 07/27/17  Yes Elsie Stain, MD    Physical Exam: Vitals:   08/23/17 1945 08/23/17 2015 08/23/17 2045 08/23/17 2115  BP: (!) 110/96 (!) 137/92 134/85 125/85  Pulse: 78 71 65 60  Resp: (!) 21 15 10  (!) 21  Temp:      TempSrc:      SpO2: 99% 99% 95% 99%    Constitutional: NAD, calm, comfortable Eyes: PERRL, lids and conjunctivae normal ENMT: Mucous membranes are moist. Posterior pharynx clear of any exudate or lesions.Normal dentition.  Neck: normal, supple, no masses, no thyromegaly Respiratory: clear to auscultation bilaterally, no wheezing, no crackles. Normal respiratory effort. No accessory muscle use.  Cardiovascular: Regular rate and rhythm, no murmurs / rubs / gallops. No extremity edema. 2+ pedal pulses. No carotid bruits.  Abdomen: no tenderness, no masses palpated. No hepatosplenomegaly. Bowel sounds positive.  Musculoskeletal: no clubbing / cyanosis. No joint deformity upper and lower extremities. Good ROM, no contractures. Normal muscle tone.  Skin: no rashes, lesions, ulcers. No induration Neurologic: CN 2-12 grossly intact. Sensation intact, DTR normal. Strength 5/5 in all 4.  Psychiatric: Normal judgment and insight. Alert and oriented x 3. Normal mood.    Labs on Admission: I have personally reviewed following labs and imaging studies  CBC:  Recent Labs Lab 08/23/17 1838  WBC 9.4  HGB 14.4  HCT 44.4  MCV 85.1  PLT 102   Basic Metabolic Panel:  Recent Labs Lab 08/23/17 1838  NA 137  K 4.0  CL 102  CO2 28  GLUCOSE 96  BUN 10  CREATININE 1.06  CALCIUM 9.3   GFR: CrCl cannot be calculated (Unknown ideal weight.). Liver Function Tests:  Recent Labs Lab 08/23/17 1838  AST 26  ALT 31  ALKPHOS 75  BILITOT 0.7  PROT 7.3  ALBUMIN 3.8   No results for input(s): LIPASE, AMYLASE in the last 168 hours. No results for input(s): AMMONIA  in the last 168 hours. Coagulation Profile:  Recent Labs Lab 08/23/17 1838  INR 0.97   Cardiac Enzymes: No results for input(s): CKTOTAL, CKMB, CKMBINDEX, TROPONINI in the last 168 hours. BNP (last 3 results) No results for input(s): PROBNP in the last 8760 hours. HbA1C: No results for input(s): HGBA1C in the last 72 hours. CBG: No results for input(s): GLUCAP in the last 168 hours. Lipid Profile: No results for input(s): CHOL, HDL, LDLCALC, TRIG, CHOLHDL, LDLDIRECT in the last 72 hours. Thyroid Function Tests: No results for input(s): TSH, T4TOTAL, FREET4, T3FREE, THYROIDAB in the last 72 hours. Anemia Panel: No results for input(s): VITAMINB12, FOLATE, FERRITIN, TIBC, IRON, RETICCTPCT in the last 72 hours. Urine analysis:    Component Value Date/Time   COLORURINE AMBER (A) 09/27/2015 0257   APPEARANCEUR CLOUDY (A) 09/27/2015 0257   LABSPEC 1.022 09/27/2015 0257   PHURINE 6.0 09/27/2015 0257   GLUCOSEU NEGATIVE 09/27/2015 0257   HGBUR LARGE (A) 09/27/2015 0257   BILIRUBINUR NEGATIVE 09/27/2015 0257   KETONESUR 15 (A) 09/27/2015 0257   PROTEINUR NEGATIVE 09/27/2015 0257   UROBILINOGEN 1.0 09/27/2015 0257   NITRITE NEGATIVE 09/27/2015 0257   LEUKOCYTESUR NEGATIVE 09/27/2015 0257    Radiological Exams on Admission: Dg Chest 2 View  Result Date: 08/23/2017 CLINICAL DATA:  Chest pain EXAM: CHEST  2 VIEW COMPARISON:  06/29/2017 chest radiograph. FINDINGS: Stable cardiomediastinal silhouette with normal heart size. No pneumothorax. No pleural effusion. Lungs appear clear, with no acute consolidative airspace disease and no pulmonary edema. IMPRESSION: No active cardiopulmonary disease. Electronically Signed   By: Ilona Sorrel M.D.   On: 08/23/2017 13:46    EKG: Independently reviewed.  Assessment/Plan Active Problems:   Chest pain, rule out acute myocardial infarction    1. CP R/O 1. CP obs pathway 2. Cards to eval in AM 3. Got ASA in ED 4. NPO after MN 5. Tele  monitor 6. Serial trops 7. Will also order 2D echo given the 1 year history of S1QT3 on EKGs.  DVT prophylaxis: Lovenox Code Status: Full Family Communication: No family in room Disposition Plan: Home after admit Consults called: EDP called cards, sent message to P. Trent for routine AM consult Admission status: Place in [place]. DO [facility] Pager [phone]  If 7AM-7PM, please contact day team taking care of patient [email or web address] Password [email or web address]  08/23/2017, 11:02 PM

## 2017-08-23 NOTE — ED Notes (Signed)
MD Tegeler at there bedside

## 2017-08-23 NOTE — ED Provider Notes (Signed)
[place] DEPT Provider Note   CSN: [id number] Arrival date & time: 08/23/17  1249     History   Chief Complaint Chief Complaint  Patient presents with  . Chest Pain    HPI Calvin Clay is a 46 y.o. male.  The history is provided by the patient and medical records.  Chest Pain   This is a new problem. The current episode started yesterday. The problem occurs constantly. The problem has not changed since onset.The pain is associated with exertion and walking. The pain is present in the substernal region. The pain is at a severity of 8/10. The pain is moderate. The quality of the pain is described as exertional, heavy, pressure-like and dull. The pain radiates to the left jaw and left neck. Duration of episode(s) is 2 days. The symptoms are aggravated by exertion. Associated symptoms include diaphoresis, exertional chest pressure, nausea and shortness of breath. Pertinent negatives include no abdominal pain, no back pain, no cough, no fever, no lower extremity edema, no malaise/fatigue, no near-syncope, no numbness, no palpitations and no syncope. He has tried rest for the symptoms. The treatment provided mild relief.  Pertinent negatives for past medical history include no seizures.    Past Medical History:  Diagnosis Date  . Asthma   . GERD (gastroesophageal reflux disease)   . Migraine     Patient Active Problem List   Diagnosis Date Noted  . Tremor 07/27/2017  . Severe persistent asthma without complication 17/61/6073  . Asthma exacerbation 06/30/2017  . Migraine 09/16/2012    Past Surgical History:  Procedure Laterality Date  . COSMETIC SURGERY    . ESOPHAGOGASTRODUODENOSCOPY  09/16/2012   Procedure: ESOPHAGOGASTRODUODENOSCOPY (EGD);  Surgeon: Ladene Artist, MD,FACG;  Location: [facility] ENDOSCOPY;  Service: Endoscopy;  Laterality: N/A;       Home Medications    Prior to Admission medications   Medication Sig Start Date End Date Taking? Authorizing Provider    albuterol (PROVENTIL HFA;VENTOLIN HFA) 108 (90 Base) MCG/ACT inhaler Inhale 2 puffs into the lungs every 6 (six) hours as needed for wheezing or shortness of breath. 07/27/17   Elsie Stain, MD  budesonide-formoterol [facility]) 160-4.5 MCG/ACT inhaler Inhale 2 puffs into the lungs 2 (two) times daily. 07/27/17   Elsie Stain, MD  SUMAtriptan (IMITREX) 100 MG tablet Take 1 tablet by mouth as needed.    [provider]    Family History Family History  Problem Relation Age of Onset  . Colon cancer Mother   . Colon cancer Father     Social History Social History  Substance Use Topics  . Smoking status: Never Smoker  . Smokeless tobacco: Never Used  . Alcohol use Yes     Comment: occasional     Allergies   Iodine and Shrimp [shellfish allergy]   Review of Systems Review of Systems  Constitutional: Positive for diaphoresis. Negative for chills, fatigue, fever and malaise/fatigue.  HENT: Negative for congestion.   Respiratory: Positive for chest tightness and shortness of breath. Negative for cough, wheezing and stridor.   Cardiovascular: Positive for chest pain. Negative for palpitations, leg swelling, syncope and near-syncope.  Gastrointestinal: Positive for nausea. Negative for abdominal pain, constipation and diarrhea.  Genitourinary: Negative for dysuria.  Musculoskeletal: Negative for back pain, neck pain and neck stiffness.  Skin: Negative for rash and wound.  Neurological: Positive for light-headedness. Negative for seizures, syncope and numbness.  Psychiatric/Behavioral: Negative for confusion.  All other systems reviewed and are negative.  Physical Exam Updated Vital Signs BP 128/87 (BP Location: Right Arm)   Pulse 90   Temp 98.3 F (36.8 C) (Oral)   Resp 16   SpO2 97%   Physical Exam  Constitutional: He is oriented to person, place, and time. He appears well-developed and well-nourished. No distress.  HENT:  Head: Normocephalic and  atraumatic.  Mouth/Throat: Oropharynx is clear and moist. No oropharyngeal exudate.  Eyes: Pupils are equal, round, and reactive to light. Conjunctivae and EOM are normal.  Neck: Normal range of motion. Neck supple.  Cardiovascular: Normal rate and intact distal pulses.   No murmur heard. Pulmonary/Chest: Effort normal and breath sounds normal. No stridor. No respiratory distress. He has no wheezes. He has no rales. He exhibits no tenderness.  Abdominal: Soft. There is no tenderness.  Musculoskeletal: He exhibits no edema or tenderness.  Neurological: He is alert and oriented to person, place, and time. No sensory deficit. He exhibits normal muscle tone.  Skin: Skin is warm and dry. Capillary refill takes less than 2 seconds. He is not diaphoretic. No erythema. No pallor.  Psychiatric: He has a normal mood and affect.  Nursing note and vitals reviewed.    ED Treatments / Results  Labs (all labs ordered are listed, but only abnormal results are displayed) Labs Reviewed  BASIC METABOLIC PANEL  CBC  HEPATIC FUNCTION PANEL  PROTIME-INR  D-DIMER, QUANTITATIVE (NOT AT [facility])  TROPONIN I  TROPONIN I  TROPONIN I  I-STAT TROPONIN, ED  I-STAT TROPONIN, ED  I-STAT TROPONIN, ED    EKG  EKG Interpretation  Date/Time:  Tuesday August 23 2017 12:52:19 EDT Ventricular Rate:  92 PR Interval:  170 QRS Duration: 84 QT Interval:  356 QTC Calculation: 440 R Axis:   90 Text Interpretation:  Normal sinus rhythm Rightward axis Inferior infarct , age undetermined Cannot rule out Anterior infarct , age undetermined Abnormal ECG S1Q3T3 When compared to prior, t wave inversions in lead 3 and AVF.  No STEMI Confirmed by Antony Blackbird [phone]) on 08/23/2017 5:37:48 PM       Radiology Dg Chest 2 View  Result Date: 08/23/2017 CLINICAL DATA:  Chest pain EXAM: CHEST  2 VIEW COMPARISON:  06/29/2017 chest radiograph. FINDINGS: Stable cardiomediastinal silhouette with normal heart size. No  pneumothorax. No pleural effusion. Lungs appear clear, with no acute consolidative airspace disease and no pulmonary edema. IMPRESSION: No active cardiopulmonary disease. Electronically Signed   By: Ilona Sorrel M.D.   On: 08/23/2017 13:46    Procedures Procedures (including critical care time)  Medications Ordered in ED Medications  acetaminophen (TYLENOL) tablet 650 mg (not administered)  ondansetron (ZOFRAN) injection 4 mg (not administered)  enoxaparin (LOVENOX) injection 40 mg (not administered)  aspirin chewable tablet 324 mg (324 mg Oral Given 08/23/17 1836)     Initial Impression / Assessment and Plan / ED Course  I have reviewed the triage vital signs and the nursing notes.  Pertinent labs & imaging results that were available during my care of the patient were reviewed by me and considered in my medical decision making (see chart for details).     Calvin Clay is a 46 y.o. male with a past medical history significant for asthma, migraines, and GERD who presents with exertional chest pain, shortness of breath, diaphoresis, lightheadedness, and nausea. Patient reports that he was at work yesterday when he began having his symptoms. He describes it coming on quickly and being a central chest tightness and pressure. He describes it as "something  grasping and pressing my heart rate he says it radiates towards his upper central chest and into his left jaw and left ear and left neck. He does report that he had a toothache in the left side recently but this feels worse. He says that has also radiates towards his right lateral chest. He denies recent fevers, chills, congestion, or cough. He says that when he was feeling chest pain, it was an 8 out of 10 severity. He began feeling lightheaded and near syncopal. He says that he was having a difficult time standing and had to sit down. He denied vision changes or room spinning sensation. He denies any extremity symptoms.  On exam, patient has  no focal neurologic deficits. Patient had normal gait. Patient had no nystagmus. Patient's lungs were clear. Chest is nontender. Abdomen was nontender. Patient has symmetric pulses in upper extremities.  Initial EKG showed concern for an S1/Q3/T3 pattern. It appears the patient may have had a similar pattern years ago but did not have it on his last visit.  Patient reports his pain is improved and is currently mild. He says that if he tries to walk it would be severe.  Patient will be given aspirin and have diagnostic testing to look for any cardiac or pulmonary etiology of symptoms. Embolism is considered given the EKG findings.   Pt denies a cardiac history. And no family history of heart disease.  Heart Score calculated as a 4. Anticipate following up on diagnostic testing to determine disposition.  Diagnostic testing was all reassuring. Cardiology was called given the concerning story and the heart score of 4.  Cardiology recommended hospitalist admission and they will see the patient in the morning for further assessment of his chest pain. Patient agreed with admission and hospitalist team was called.   Pt will be admitted to hospitalist service.   Final Clinical Impressions(s) / ED Diagnoses   Final diagnoses:  Precordial chest pain   Clinical Impression: 1. Precordial chest pain     Disposition: Admit to hospitalist service    Charissa Knowles, Gwenyth Allegra, MD 08/24/17 0030

## 2017-08-23 NOTE — ED Triage Notes (Signed)
To ED for further eval of cp and dizziness which started yesterday while at work. Appears in nad. No vomiting. No neuro deficits

## 2017-08-24 ENCOUNTER — Other Ambulatory Visit: Payer: Self-pay | Admitting: Physician Assistant

## 2017-08-24 ENCOUNTER — Observation Stay (HOSPITAL_BASED_OUTPATIENT_CLINIC_OR_DEPARTMENT_OTHER): Payer: Self-pay

## 2017-08-24 ENCOUNTER — Encounter (HOSPITAL_COMMUNITY): Payer: Self-pay | Admitting: *Deleted

## 2017-08-24 DIAGNOSIS — R079 Chest pain, unspecified: Secondary | ICD-10-CM

## 2017-08-24 LAB — TROPONIN I: Troponin I: <0.03 ng/mL (ref <0.03)

## 2017-08-24 LAB — ECHOCARDIOGRAM COMPLETE

## 2017-08-24 MED ORDER — PERFLUTREN LIPID MICROSPHERE
1.0000 mL | INTRAVENOUS | Status: AC | PRN
  Administered 2017-08-24: 5 mL via INTRAVENOUS
  Filled 2017-08-24: qty 10

## 2017-08-24 NOTE — ED Notes (Addendum)
Previous notes and assessments entered on the wrong person.

## 2017-08-24 NOTE — ED Notes (Signed)
Pt taken to echo.

## 2017-08-24 NOTE — Discharge Summary (Signed)
Physician Discharge Summary  Calvin Clay BJS:[id number] DOB: March 19, 1971 DOA: 08/23/2017  PCP: Everardo Beals, NP  Admit date: 08/23/2017 Discharge date: 08/24/2017   Recommendations for Outpatient Follow-Up:   1. outpatient stress test   Discharge Diagnosis:   Active Problems:   Chest pain, rule out acute myocardial infarction   Discharge disposition:  Home.  Discharge Condition: Improved.  Diet recommendation: Low sodium, heart healthy  Wound care: None.   History of Present Illness:   Calvin Clay is a 46 y.o. male with medical history significant of Asthma.  Patient presents to the ED for evaluation of CP and dizziness.  Symptoms onset yesterday while at work.  CP, SOB, nausea.  Symptoms worse with exertion, better at rest.  Radiates to left jaw, ear, and left neck.  8/10 in severity at [facility] Course by Problem:   Chest pain -echo done -CE negative -EKG nonspecific TWI -outpatient stress test    Medical Consultants:    cards   Discharge Exam:   Vitals:   08/24/17 1324 08/24/17 1350  BP: 119/79 117/71  Pulse: 81 92  Resp: 18 18  Temp:  98.7 F (37.1 C)  SpO2: 100% 100%   Vitals:   08/24/17 0841 08/24/17 1046 08/24/17 1324 08/24/17 1350  BP: 127/80 117/81 119/79 117/71  Pulse: 87 82 81 92  Resp: 16 14 18 18   Temp:    98.7 F (37.1 C)  TempSrc:    Oral  SpO2: 100% 100% 100% 100%  Weight:    128.8 kg (283 lb 14.4 oz)  Height:    6\' 3"  (1.905 m)    Gen:  NAD    The results of significant diagnostics from this hospitalization (including imaging, microbiology, ancillary and laboratory) are listed below for reference.     Procedures and Diagnostic Studies:   Dg Chest 2 View  Result Date: 08/23/2017 CLINICAL DATA:  Chest pain EXAM: CHEST  2 VIEW COMPARISON:  06/29/2017 chest radiograph. FINDINGS: Stable cardiomediastinal silhouette with normal heart size. No pneumothorax. No pleural effusion. Lungs appear clear, with  no acute consolidative airspace disease and no pulmonary edema. IMPRESSION: No active cardiopulmonary disease. Electronically Signed   By: Ilona Sorrel M.D.   On: 08/23/2017 13:46     Labs:   Basic Metabolic Panel:  Recent Labs Lab 08/23/17 1838  NA 137  K 4.0  CL 102  CO2 28  GLUCOSE 96  BUN 10  CREATININE 1.06  CALCIUM 9.3   GFR Estimated Creatinine Clearance: 128.6 mL/min (by C-G formula based on SCr of 1.06 mg/dL). Liver Function Tests:  Recent Labs Lab 08/23/17 1838  AST 26  ALT 31  ALKPHOS 75  BILITOT 0.7  PROT 7.3  ALBUMIN 3.8   No results for input(s): LIPASE, AMYLASE in the last 168 hours. No results for input(s): AMMONIA in the last 168 hours. Coagulation profile  Recent Labs Lab 08/23/17 1838  INR 0.97    CBC:  Recent Labs Lab 08/23/17 1838  WBC 9.4  HGB 14.4  HCT 44.4  MCV 85.1  PLT 261   Cardiac Enzymes:  Recent Labs Lab 08/24/17 0354 08/24/17 1119  TROPONINI <0.03 <0.03   BNP: Invalid input(s): POCBNP CBG: No results for input(s): GLUCAP in the last 168 hours. D-Dimer  Recent Labs  08/23/17 1838  DDIMER 0.34   Hgb A1c No results for input(s): HGBA1C in the last 72 hours. Lipid Profile No results for input(s): CHOL, HDL, LDLCALC, TRIG, CHOLHDL, LDLDIRECT in the  last 72 hours. Thyroid function studies No results for input(s): TSH, T4TOTAL, T3FREE, THYROIDAB in the last 72 hours.  Invalid input(s): FREET3 Anemia work up No results for input(s): VITAMINB12, FOLATE, FERRITIN, TIBC, IRON, RETICCTPCT in the last 72 hours. Microbiology No results found for this or any previous visit (from the past 240 hour(s)).   Discharge Instructions:   Discharge Instructions    Diet - low sodium heart healthy    Complete by:  As directed    Discharge instructions    Complete by:  As directed    Outpatient stress test to be arranged   Increase activity slowly    Complete by:  As directed      Allergies as of 08/24/2017       Reactions   Iodine Anaphylaxis   Shrimp [shellfish Allergy] Swelling   Throat closing      Medication List    TAKE these medications   albuterol 108 (90 Base) MCG/ACT inhaler Commonly known as:  PROVENTIL HFA;VENTOLIN HFA Inhale 2 puffs into the lungs every 6 (six) hours as needed for wheezing or shortness of breath.   BC HEADACHE POWDER PO Take 1 Package by mouth 3 (three) times daily as needed (headache).   budesonide-formoterol 160-4.5 MCG/ACT inhaler Commonly known as:  SYMBICORT Inhale 2 puffs into the lungs 2 (two) times daily.            Discharge Care Instructions        Start     Ordered   08/24/17 0000  Increase activity slowly     08/24/17 1441   08/24/17 0000  Diet - low sodium heart healthy     08/24/17 1441   08/24/17 0000  Discharge instructions    Comments:  Outpatient stress test to be arranged   08/24/17 1441     Follow-up Information    [place] [facility]. Go on 08/25/2017.   Specialty:  Cardiology Why:  @ 2pm for stress test  Contact information: [address], [address] [place]       Isaiah Serge, NP Follow up on 09/15/2017.   Specialties:  Cardiology, Radiology Why:  @3 :30pm for post hospital  Contact information: [address] [place] [address] [phone]        Everardo Beals, NP Follow up in 1 week(s).   Contact information: [facility] [place] [address] [phone]            Time coordinating discharge: 35 min  Signed:  Kameran Mcneese Alison Stalling   [facility] 08/24/2017, 2:42 PM

## 2017-08-24 NOTE — Progress Notes (Signed)
PROGRESS NOTE    Calvin Clay  ZOX:[id number] DOB: [birth year] DOA: 08/23/2017 PCP: Everardo Beals, NP   Outpatient Specialists:     Brief Narrative:  Calvin Clay is a 46 y.o. male with medical history significant of Asthma.  Patient presents to the ED for evaluation of CP and dizziness.  Symptoms onset yesterday while at work.  CP, SOB, nausea.  Symptoms worse with exertion, better at rest.  Radiates to left jaw, ear, and left neck.  8/10 in severity at worst.   Assessment & Plan:   Active Problems:   Chest pain, rule out acute myocardial infarction   CP R/O -CE negative -EKG  -NPO for cardiology eval -echo read pending   DVT prophylaxis:  Lovenox   Code Status: Full Code   Family Communication:   Disposition Plan:     Consultants:   cards   Subjective: No current complaints -no known family h/o CAD  Objective: Vitals:   08/24/17 0248 08/24/17 0548 08/24/17 0841 08/24/17 1046  BP: 128/72 132/72 127/80 117/81  Pulse: 66 78 87 82  Resp: 18 12 16 14   Temp:      TempSrc:      SpO2: 97% 98% 100% 100%   No intake or output data in the 24 hours ending 08/24/17 1324 There were no vitals filed for this visit.  Examination:  General exam: Appears calm and comfortable  Respiratory system: Clear to auscultation. Respiratory effort normal. Cardiovascular system: S1 & S2 heard, RRR. No JVD, murmurs, rubs, gallops or clicks. No pedal edema. Gastrointestinal system: Abdomen is nondistended, soft and nontender. No organomegaly or masses felt. Normal bowel sounds heard. Central nervous system: Alert and oriented. No focal neurological deficits. Extremities: Symmetric 5 x 5 power. Skin: No rashes, lesions or ulcers Psychiatry: Judgement and insight appear normal. Mood & affect appropriate.     Data Reviewed: I have personally reviewed following labs and imaging studies  CBC:  Recent Labs Lab 08/23/17 1838  WBC 9.4  HGB 14.4  HCT 44.4  MCV  85.1  PLT 811   Basic Metabolic Panel:  Recent Labs Lab 08/23/17 1838  NA 137  K 4.0  CL 102  CO2 28  GLUCOSE 96  BUN 10  CREATININE 1.06  CALCIUM 9.3   GFR: CrCl cannot be calculated (Unknown ideal weight.). Liver Function Tests:  Recent Labs Lab 08/23/17 1838  AST 26  ALT 31  ALKPHOS 75  BILITOT 0.7  PROT 7.3  ALBUMIN 3.8   No results for input(s): LIPASE, AMYLASE in the last 168 hours. No results for input(s): AMMONIA in the last 168 hours. Coagulation Profile:  Recent Labs Lab 08/23/17 1838  INR 0.97   Cardiac Enzymes:  Recent Labs Lab 08/24/17 0354 08/24/17 1119  TROPONINI <0.03 <0.03   BNP (last 3 results) No results for input(s): PROBNP in the last 8760 hours. HbA1C: No results for input(s): HGBA1C in the last 72 hours. CBG: No results for input(s): GLUCAP in the last 168 hours. Lipid Profile: No results for input(s): CHOL, HDL, LDLCALC, TRIG, CHOLHDL, LDLDIRECT in the last 72 hours. Thyroid Function Tests: No results for input(s): TSH, T4TOTAL, FREET4, T3FREE, THYROIDAB in the last 72 hours. Anemia Panel: No results for input(s): VITAMINB12, FOLATE, FERRITIN, TIBC, IRON, RETICCTPCT in the last 72 hours. Urine analysis:    Component Value Date/Time   COLORURINE AMBER (A) 09/27/2015 0257   APPEARANCEUR CLOUDY (A) 09/27/2015 0257   LABSPEC 1.022 09/27/2015 0257   PHURINE 6.0 09/27/2015 0257  GLUCOSEU NEGATIVE 09/27/2015 0257   HGBUR LARGE (A) 09/27/2015 0257   BILIRUBINUR NEGATIVE 09/27/2015 0257   KETONESUR 15 (A) 09/27/2015 0257   PROTEINUR NEGATIVE 09/27/2015 0257   UROBILINOGEN 1.0 09/27/2015 0257   NITRITE NEGATIVE 09/27/2015 0257   LEUKOCYTESUR NEGATIVE 09/27/2015 0257     )No results found for this or any previous visit (from the past 240 hour(s)).    Anti-infectives    None       Radiology Studies: Dg Chest 2 View  Result Date: 08/23/2017 CLINICAL DATA:  Chest pain EXAM: CHEST  2 VIEW COMPARISON:  06/29/2017 chest  radiograph. FINDINGS: Stable cardiomediastinal silhouette with normal heart size. No pneumothorax. No pleural effusion. Lungs appear clear, with no acute consolidative airspace disease and no pulmonary edema. IMPRESSION: No active cardiopulmonary disease. Electronically Signed   By: Ilona Sorrel M.D.   On: 08/23/2017 13:46        Scheduled Meds: . enoxaparin (LOVENOX) injection  40 mg Subcutaneous Daily   Continuous Infusions:   LOS: 0 days    Time spent: 25 min    [place], DO [facility] Pager [phone]  If 7PM-7AM, please contact night-coverage [email or web address] Password [email or web address] 08/24/2017, 1:24 PM

## 2017-08-24 NOTE — Consult Note (Signed)
Cardiology Consultation:   Patient ID: Calvin Clay; [id number]; May 08, 1971   Admit date: 08/23/2017 Date of Consult: 08/24/2017  Primary Care Provider: Everardo Beals, NP Primary Cardiologist: New to Dr. Marlou Clay   Patient Profile:   Calvin Clay is a 46 y.o. male with a hx of Asthma and GERD  who is being seen today for the evaluation of chest pain at the request of Dr. Eliseo Clay.   No prior hx of CAD. No family hx of MI or premature death.   History of Present Illness:   [name] presented for chest pain evaluation. First episode occurred 9/17 at work. Patient had a sudden onset of substernal "squeezing chest pressure "while at work. Associated with shortness of breath. Blood pressure was elevated. He at something for possible hypoglycemic event. Repeat blood pressure was normal. Evaluated by provider at work who noted "skipped beat". He felt better overnight. However driving at work 4/81 he again had a intermittent chest discomfort. This was worsened again at work with a worse episode of squeezing chest pressure associated with shortness of breath. He was sent to urgent care and further directly to ER for further evaluation. No similar episode in the past. Different from asthma exacerbation and typical GERD symptoms. Denies orthopnea, PND, syncope, lower extremity edema, melena or blood in his stool or urine. As noted chest fluttering during these 2 episodes. He drinks 3 cups of coffee every day. Works 2 full-time job. Denies tobacco smoking or illicit drug use. No recent travel. Social drinking. He has recent toothache and earache.  Point-of-care troponin x 2 negative. Troponin I negative x 2. Electrolyte and serum creatinine normal. Normal d-dimer. Chest x-ray without acute cardiopulmonary disease. Echocardiogram showed normal LV function without wall motion abnormality. Normal diastolic dysfunction.   Past Medical History:  Diagnosis Date  . Asthma   . GERD (gastroesophageal reflux  disease)   . Migraine     Past Surgical History:  Procedure Laterality Date  . COSMETIC SURGERY    . ESOPHAGOGASTRODUODENOSCOPY  09/16/2012   Procedure: ESOPHAGOGASTRODUODENOSCOPY (EGD);  Surgeon: Ladene Artist, MD,FACG;  Location: [facility] ENDOSCOPY;  Service: Endoscopy;  Laterality: N/A;    Inpatient Medications: Scheduled Meds: . enoxaparin (LOVENOX) injection  40 mg Subcutaneous Daily   Continuous Infusions:  PRN Meds: acetaminophen, ondansetron (ZOFRAN) IV, perflutren lipid microspheres (DEFINITY) IV suspension  Allergies:    Allergies  Allergen Reactions  . Iodine Anaphylaxis  . Shrimp [Shellfish Allergy] Swelling    Throat closing    Social History:   Social History   Social History  . Marital status: Married    Spouse name: N/A  . Number of children: N/A  . Years of education: N/A   Occupational History  . Not on file.   Social History Main Topics  . Smoking status: Never Smoker  . Smokeless tobacco: Never Used  . Alcohol use Yes     Comment: occasional  . Drug use: No  . Sexual activity: Not on file   Other Topics Concern  . Not on file   Social History Narrative  . No narrative on file    Family History:    Family History  Problem Relation Age of Onset  . Colon cancer Mother   . Colon cancer Father      ROS:  Please see the history of present illness.  ROS All other ROS reviewed and negative.     Physical Exam/Data:   Vitals:   08/24/17 0548 08/24/17 0841 08/24/17 1046 08/24/17 1324  BP: 132/72 127/80 117/81 119/79  Pulse: 78 87 82 81  Resp: 12 16 14 18   Temp:      TempSrc:      SpO2: 98% 100% 100% 100%   No intake or output data in the 24 hours ending 08/24/17 1400 There were no vitals filed for this visit. There is no height or weight on file to calculate BMI.  General:  Well nourished, well developed, in no acute distress HEENT: normal Lymph: no adenopathy Neck: no JVD Endocrine:  No thryomegaly Vascular: No carotid bruits;  FA pulses 2+ bilaterally without bruits  Cardiac:  normal S1, S2; RRR; no murmur  Lungs:  clear to auscultation bilaterally, no wheezing, rhonchi or rales  Abd: soft, nontender, no hepatomegaly  Ext: no edema Musculoskeletal:  No deformities, BUE and BLE strength normal and equal Skin: warm and dry  Neuro:  CNs 2-12 intact, no focal abnormalities noted Psych:  Normal affect   EKG:  The EKG was personally reviewed and demonstrates:  Sinus rhythm with T-wave inversion in lead 3 and aVF. New compared to prior EKG of 06/30/17. Telemetry:  Telemetry was personally reviewed and demonstrates:  Sinus rhythm at rate of 90s to 100.  Relevant CV Studies: Echo 08/24/17 Study Conclusions  - Left ventricle: The cavity size was normal. There was moderate   focal basal hypertrophy. Systolic function was normal. The   estimated ejection fraction was in the range of 55% to 60%. Wall   motion was normal; there were no regional wall motion   abnormalities. Left ventricular diastolic function parameters   were normal. - Mitral valve: Calcified annulus. Minimal focal calcification of   the anterior leaflet (medial segment(s)).   Laboratory Data:  Chemistry Recent Labs Lab 08/23/17 1838  NA 137  K 4.0  CL 102  CO2 28  GLUCOSE 96  BUN 10  CREATININE 1.06  CALCIUM 9.3  GFRNONAA >60  GFRAA >60  ANIONGAP 7     Recent Labs Lab 08/23/17 1838  PROT 7.3  ALBUMIN 3.8  AST 26  ALT 31  ALKPHOS 75  BILITOT 0.7   Hematology Recent Labs Lab 08/23/17 1838  WBC 9.4  RBC 5.22  HGB 14.4  HCT 44.4  MCV 85.1  MCH 27.6  MCHC 32.4  RDW 14.6  PLT 261   Cardiac Enzymes Recent Labs Lab 08/24/17 0354 08/24/17 1119  TROPONINI <0.03 <0.03    Recent Labs Lab 08/23/17 1844 08/23/17 2159  TROPIPOC 0.00 0.00    BNPNo results for input(s): BNP, PROBNP in the last 168 hours.  DDimer  Recent Labs Lab 08/23/17 1838  DDIMER 0.34    Radiology/Studies:  Dg Chest 2 View  Result Date:  08/23/2017 CLINICAL DATA:  Chest pain EXAM: CHEST  2 VIEW COMPARISON:  06/29/2017 chest radiograph. FINDINGS: Stable cardiomediastinal silhouette with normal heart size. No pneumothorax. No pleural effusion. Lungs appear clear, with no acute consolidative airspace disease and no pulmonary edema. IMPRESSION: No active cardiopulmonary disease. Electronically Signed   By: Ilona Sorrel M.D.   On: 08/23/2017 13:46    Assessment and Plan:    1. Chest pain  - 2 episodes. No reoccurrence while here. Troponin negative. EKG with non specific TWI. Different than typical GERD and asthma exacerbation. Could be tachycardia related. Advised to cut back on her caffeine intake. D-dimer and echocardiogram reassuring. He can eat. Outpatient exercise treadmill test. He can be discharge on cardiac stand point.    For questions or updates, please contact [facility] Please consult [email or web address] for contact info under Cardiology/STEMI.   Calvin Soho, PA  08/24/2017 2:00 PM   Personally seen and examined. Agree with above.  46 year old male with chest pain, neg trop, non smoker under increased stress.  Chest pain  - will set up for non invasive evaluation with ETT.   - currently CP free. Does have HA. Getting Tylenol.   - ECG with Nonspecific ST-T wave changes inferiorly.  - Differential includes musculoskeletal, GERD-like discomfort, as well as cardiac chest pain.  - Reassuring echocardiogram with normal ejection fraction.  Morbid obesity  - Continue to encourage weight loss  I'm comfortable with discharge. We will be in contact for exercise treadmill test.  Candee Furbish, MD]

## 2017-08-24 NOTE — Progress Notes (Signed)
  Echocardiogram 2D Echocardiogram with definity has been performed.  Darlina Sicilian M 08/24/2017, 12:38 PM

## 2017-08-25 ENCOUNTER — Ambulatory Visit (INDEPENDENT_AMBULATORY_CARE_PROVIDER_SITE_OTHER): Payer: Self-pay

## 2017-08-25 DIAGNOSIS — R079 Chest pain, unspecified: Secondary | ICD-10-CM

## 2017-08-25 LAB — EXERCISE TOLERANCE TEST
CHL CUP MPHR: 176 {beats}/min
CHL CUP RESTING HR STRESS: 103 {beats}/min
CHL RATE OF PERCEIVED EXERTION: 15
CSEPED: 6 min
Estimated workload: 7 METS
Exercise duration (sec): 0 s
Peak HR: 184 {beats}/min
Percent HR: 104 %

## 2017-08-26 ENCOUNTER — Encounter: Payer: Self-pay | Admitting: *Deleted

## 2017-08-26 ENCOUNTER — Telehealth: Payer: Self-pay | Admitting: Cardiology

## 2017-08-26 NOTE — Telephone Encounter (Signed)
New message      Pt had a treadmill test yesterday.  In order to return to work today at 39, he need an "ok to return to work" note.  Please call pt and he will come by today and pick up note.  He stated that he was in the area and may "drop" by.  He works for [employer]

## 2017-08-26 NOTE — Telephone Encounter (Signed)
Calvin Clay,  Patient will normal stress test yesterday- is he ok to return to work?  See below.  Thanks, [employer]

## 2017-08-26 NOTE — Telephone Encounter (Signed)
Yes

## 2017-08-26 NOTE — Telephone Encounter (Signed)
Return to work note done for patient- he is currently here to pick this up.  Given to patient.

## 2017-09-01 ENCOUNTER — Encounter: Payer: Self-pay | Admitting: *Deleted

## 2017-09-02 ENCOUNTER — Encounter: Payer: Self-pay | Admitting: Cardiology

## 2017-09-15 ENCOUNTER — Ambulatory Visit: Payer: Self-pay | Admitting: Cardiology

## 2017-09-15 NOTE — Progress Notes (Deleted)
Cardiology Office Note:    Date:  09/15/2017   ID:  Calvin Clay, DOB 08-15-1971, MRN [id number]  PCP:  Everardo Beals, NP  Cardiologist:  Dr Marlou Porch   Referring MD: Everardo Beals, NP   No chief complaint on file. ***  History of Present Illness:    Calvin Clay is a 46 y.o. male with a past medical history significant for asthma and GERD. The patient was hospitalized on 08/23/17 or chest pain evaluation. The patient had experienced a sudden onset of substernal "squeezing chest pressure" associated with shortness of breath while at work. His blood pressure was elevated. He was evaluated by a provider at work who noted a "skipped beat". He felt better overnight, however driving to work the next morning he began to have intermittent chest discomfort. This worsened again at work. He went to urgent care who sent him to the hospital for further evaluation. This was not similar to his prior asthma exacerbations or GERD symptoms.   Troponins were negative, electrolytes and serum creatinine were normal, d-dimer was not elevated, chest x-ray was normal. Echocardiogram showed normal LV function without wall motion abnormality, and normal diastolic function. The patient was discharged from the hospital and had an outpatient exercise stress test on 08/25/17 which was normal showing no ischemia and normal blood pressure response to stress and decreased exercise capacity.  The patient denies tobacco smoking or illicit drug use. He is a social drinker. He drinks 3 cups of coffee per day. He works 2 full-time jobs.  Past Medical History:  Diagnosis Date  . Asthma   . GERD (gastroesophageal reflux disease)   . Migraine     Past Surgical History:  Procedure Laterality Date  . COSMETIC SURGERY    . ESOPHAGOGASTRODUODENOSCOPY  09/16/2012   Procedure: ESOPHAGOGASTRODUODENOSCOPY (EGD);  Surgeon: Ladene Artist, MD,FACG;  Location: [facility] ENDOSCOPY;  Service: Endoscopy;  Laterality: N/A;     Current Medications: No outpatient prescriptions have been marked as taking for the 09/15/17 encounter (Appointment) with Calvin Serge, NP.     Allergies:   Iodine and Shrimp [shellfish allergy]   Social History   Social History  . Marital status: Married    Spouse name: N/A  . Number of children: N/A  . Years of education: N/A   Social History Main Topics  . Smoking status: Never Smoker  . Smokeless tobacco: Never Used  . Alcohol use Yes     Comment: occasional  . Drug use: No  . Sexual activity: Not on file   Other Topics Concern  . Not on file   Social History Narrative  . No narrative on file     Family History: The patient's ***family history includes Colon cancer in his father and mother. ROS:   Please see the history of present illness.    *** All other systems reviewed and are negative.  EKGs/Labs/Other Studies Reviewed:    The following studies were reviewed today:  Exercise stress test 08/25/2017 Study Highlights    Blood pressure demonstrated a normal response to exercise.  There was no ST segment deviation noted during stress. No ischemia, normal stress test.  Normal BP response to stress.  Decreased exercise capacity.    Echo 08/24/17 Study Conclusions  - Left ventricle: The cavity size was normal. There was moderate focal basal hypertrophy. Systolic function was normal. The estimated ejection fraction was in the range of 55% to 60%. Wall motion was normal; there were no regional wall motion abnormalities. Left ventricular diastolic  function parameters were normal. - Mitral valve: Calcified annulus. Minimal focal calcification of the anterior leaflet (medial segment(s)).   EKG:  EKG is *** ordered today.  The ekg ordered today demonstrates ***  Recent Labs: 06/30/2017: Magnesium 2.1 08/23/2017: ALT 31; BUN 10; Creatinine, Ser 1.06; Hemoglobin 14.4; Platelets 261; Potassium 4.0; Sodium 137   Recent Lipid Panel No  results found for: CHOL, TRIG, HDL, CHOLHDL, VLDL, LDLCALC, LDLDIRECT  Physical Exam:    VS:  There were no vitals taken for this visit.    Wt Readings from Last 3 Encounters:  08/24/17 283 lb 14.4 oz (128.8 kg)  07/27/17 295 lb 9.6 oz (134.1 kg)  06/30/17 288 lb 11.2 oz (131 kg)     Physical Exam***   ASSESSMENT:    No diagnosis found. PLAN:    In order of problems listed above:  1. ***   Medication Adjustments/Labs and Tests Ordered: Current medicines are reviewed at length with the patient today.  Concerns regarding medicines are outlined above. Labs and tests ordered and medication changes are outlined in the patient instructions below:  There are no Patient Instructions on file for this visit.   Signed, Daune Perch, NP  09/15/2017 1:26 PM    [facility]

## 2017-09-21 ENCOUNTER — Ambulatory Visit: Payer: Self-pay | Admitting: Family Medicine

## 2017-09-21 ENCOUNTER — Ambulatory Visit: Payer: Self-pay | Admitting: Critical Care Medicine

## 2017-10-05 NOTE — Progress Notes (Signed)
Cardiology Office Note   Date:  10/06/2017   ID:  Carol Loftin, DOB 01/05/1971, MRN [id number]  PCP:  Everardo Beals, NP  Cardiologist:  Dr. Marlou Porch   Chief Complaint  Patient presents with  . Hospitalization Follow-up      History of Present Illness: Calvin Clay is a 46 y.o. male who presents for post hospitalization 08/23/17-08/24/17 for chest pain.  Pain described as squeezing chest pressure.  Troponin neg. X 4, echo with normal EF.  EKG with non specific TWI. He has a hx of Asthma and GERD.  Pt had POET that was normal and normal BP with test.     In July Iron was low.   Today he has no more chest pain.  No SOB but daily episodes of significant dizziness- room spinning he feels he may fall at times.  He does have hx migraines.  He complains of fatigue. Still working 2 jobs and sleeps 4-5 hours per night.  He is eating heathy and has decreased his coffee.     Past Medical History:  Diagnosis Date  . Asthma   . GERD (gastroesophageal reflux disease)   . Migraine     Past Surgical History:  Procedure Laterality Date  . COSMETIC SURGERY    . ESOPHAGOGASTRODUODENOSCOPY  09/16/2012   Procedure: ESOPHAGOGASTRODUODENOSCOPY (EGD);  Surgeon: Ladene Artist, MD,FACG;  Location: [facility] ENDOSCOPY;  Service: Endoscopy;  Laterality: N/A;     Current Outpatient Prescriptions  Medication Sig Dispense Refill  . albuterol (PROVENTIL HFA;VENTOLIN HFA) 108 (90 Base) MCG/ACT inhaler Inhale 2 puffs into the lungs every 6 (six) hours as needed for wheezing or shortness of breath. 1 Inhaler 2  . Aspirin-Salicylamide-Caffeine (BC HEADACHE POWDER PO) Take 1 Package by mouth 3 (three) times daily as needed (headache).    . budesonide-formoterol (SYMBICORT) 160-4.5 MCG/ACT inhaler Inhale 2 puffs into the lungs 2 (two) times daily. 1 Inhaler 11   No current facility-administered medications for this visit.     Allergies:   Iodine and Shrimp [shellfish allergy]    Social History:  The  patient  reports that he has never smoked. He has never used smokeless tobacco. He reports that he drinks alcohol. He reports that he does not use drugs.   Family History:  The patient's family history includes Colon cancer in his father and mother.    ROS:  General:no colds or fevers, + weight changes but today with big shoes and clothes Skin:no rashes or ulcers HEENT:no blurred vision, no congestion CV:see HPI PUL:see HPI GI:no diarrhea constipation or melena, no indigestion GU:no hematuria, no dysuria MS:no joint pain, no claudication Neuro:no syncope, no lightheadedness, + room spinning dizziness Endo:no diabetes, no thyroid disease  Wt Readings from Last 3 Encounters:  10/06/17 (!) 302 lb 12.8 oz (137.3 kg)  08/24/17 283 lb 14.4 oz (128.8 kg)  07/27/17 295 lb 9.6 oz (134.1 kg)     PHYSICAL EXAM: VS:  BP 122/80   Pulse 100   Ht 6\' 3"  (1.905 m)   Wt (!) 302 lb 12.8 oz (137.3 kg)   SpO2 98%   BMI 37.85 kg/m  , BMI Body mass index is 37.85 kg/m. General:Pleasant affect, NAD Skin:Warm and dry, brisk capillary refill HEENT:normocephalic, sclera clear, mucus membranes moist Neck:supple, no JVD, no bruits  Heart:S1S2 RRR without murmur, gallup, rub or click Lungs:clear without rales, rhonchi, or wheezes VZC:HYIF, non tender, + BS, do not palpate liver spleen or masses Ext:no lower ext edema, 2+ pedal pulses, 2+ radial  pulses Neuro:alert and oriented, MAE, follows commands, + facial symmetry    EKG:  EKG is NOT ordered today.   Recent Labs: 06/30/2017: Magnesium 2.1 08/23/2017: ALT 31; BUN 10; Creatinine, Ser 1.06; Hemoglobin 14.4; Platelets 261; Potassium 4.0; Sodium 137    Lipid Panel No results found for: CHOL, TRIG, HDL, CHOLHDL, VLDL, LDLCALC, LDLDIRECT     Other studies Reviewed: Additional studies/ records that were reviewed today include: .  ETT Study Highlights     Blood pressure demonstrated a normal response to exercise.  There was no ST segment  deviation noted during stress.   No ischemia, normal stress test.  Normal BP response to stress.  Decreased exercise capacity.    Echo 08/24/17 Study Conclusions  - Left ventricle: The cavity size was normal. There was moderate   focal basal hypertrophy. Systolic function was normal. The   estimated ejection fraction was in the range of 55% to 60%. Wall   motion was normal; there were no regional wall motion   abnormalities. Left ventricular diastolic function parameters   were normal. - Mitral valve: Calcified annulus. Minimal focal calcification of   the anterior leaflet (medial segment(s)).   ASSESSMENT AND PLAN:  1.  Chest pain neg ETT and no further episodes.     2.  Fatigue will check thyroid, I do not see any eval.  Hx of Iron def.  Recommend he see his PCP for management.    3.. Room spinning dizziness. Will have wear 48 hour monitor as these occur daily to eval for . He will try meclazine 25 mg at HS, prn, he is concerned about taking in day with his work.  If symptoms and no arrthymias will have him follow up with PCP.  But if issues he will follow up with Dr. Marlou Porch.   Current medicines are reviewed with the patient today.  The patient Has no concerns regarding medicines.  The following changes have been made:  See above Labs/ tests ordered today include:see above  Disposition:   FU:  see above  Signed, Cecilie Kicks, NP  10/06/2017 9:34 AM    [address], [place], [place], [place] Phone: [phone]; Fax: [phone]

## 2017-10-06 ENCOUNTER — Ambulatory Visit (INDEPENDENT_AMBULATORY_CARE_PROVIDER_SITE_OTHER): Payer: Self-pay | Admitting: Cardiology

## 2017-10-06 ENCOUNTER — Encounter: Payer: Self-pay | Admitting: Cardiology

## 2017-10-06 ENCOUNTER — Encounter (INDEPENDENT_AMBULATORY_CARE_PROVIDER_SITE_OTHER): Payer: Self-pay

## 2017-10-06 VITALS — BP 122/80 | HR 100 | Ht 75.0 in | Wt 302.8 lb

## 2017-10-06 DIAGNOSIS — R5383 Other fatigue: Secondary | ICD-10-CM

## 2017-10-06 DIAGNOSIS — R079 Chest pain, unspecified: Secondary | ICD-10-CM

## 2017-10-06 DIAGNOSIS — R42 Dizziness and giddiness: Secondary | ICD-10-CM

## 2017-10-06 LAB — TSH: TSH: 3.44 u[IU]/mL (ref 0.450–4.500)

## 2017-10-06 LAB — T4, FREE: FREE T4: 0.85 ng/dL (ref 0.82–1.77)

## 2017-10-06 MED ORDER — MECLIZINE HCL 25 MG PO TABS: 25.0000 mg | ORAL_TABLET | Freq: Every evening | ORAL | 0 refills | Status: DC | PRN

## 2017-10-06 NOTE — Patient Instructions (Signed)
Medication Instructions: Your physician has recommended you make the following change in your medication:  -1) START Meclizine (Antivert) 25 mg - Take 1 tablet (25 mg) by mouth daily at bedtime as needed for dizziness  Labwork: Your physician recommends that you have lab work today: TSH and Free T4  Procedures/Testing: Your physician has recommended that you wear a 48 hour holter monitor. Holter monitors are medical devices that record the heart's electrical activity. Doctors most often use these monitors to diagnose arrhythmias. Arrhythmias are problems with the speed or rhythm of the heartbeat. The monitor is a small, portable device. You can wear one while you do your normal daily activities. This is usually used to diagnose what is causing palpitations/syncope (passing out).  Follow-Up: Your physician recommends that you schedule a follow-up appointment as needed with our office. We encourage you to follow up with your Primary Care Physician.    If you need a refill on your cardiac medications before your next appointment, please call your pharmacy.

## 2017-10-07 ENCOUNTER — Telehealth: Payer: Self-pay

## 2017-10-07 NOTE — Telephone Encounter (Signed)
-----   Message from Isaiah Serge, NP sent at 10/06/2017  6:30 PM EDT ----- Please let pt know his thyroid was normal.

## 2017-10-07 NOTE — Telephone Encounter (Signed)
Called patient to give lab results. No answer and unable to leave vm.

## 2017-10-11 NOTE — Telephone Encounter (Signed)
New message    Pt was calling back to get his lab results

## 2017-10-11 NOTE — Telephone Encounter (Signed)
Per the DPR on file left vm on home vm with recent thyroid lab results. Thyroid labs were normal. Let patient know if he had any questions or concerns to please call office at [phone].

## 2017-10-12 NOTE — Telephone Encounter (Signed)
Spoke with wife (ok per DPR) and gave her recent thyroid results. She verbalized understanding.

## 2017-10-17 MED FILL — SYMBICORT 160-4.5 MCG INH: 160-4.5 | 30 days supply | Qty: 10 | Fill #0

## 2017-10-17 MED FILL — !VENTOLIN HFA INHALER: 108 (90 BAS | 25 days supply | Qty: 18 | Fill #0

## 2017-10-20 ENCOUNTER — Ambulatory Visit (INDEPENDENT_AMBULATORY_CARE_PROVIDER_SITE_OTHER): Payer: Self-pay

## 2017-10-20 ENCOUNTER — Encounter (INDEPENDENT_AMBULATORY_CARE_PROVIDER_SITE_OTHER): Payer: Self-pay

## 2017-10-20 DIAGNOSIS — R42 Dizziness and giddiness: Secondary | ICD-10-CM

## 2017-10-21 ENCOUNTER — Encounter: Payer: Self-pay | Admitting: *Deleted

## 2017-10-21 ENCOUNTER — Ambulatory Visit: Payer: Self-pay | Attending: Internal Medicine

## 2017-11-11 ENCOUNTER — Ambulatory Visit: Payer: Self-pay | Admitting: Internal Medicine

## 2018-01-01 IMAGING — DX DG CHEST 2V
2 series · 2 of 2 positions shown · non-contrast
Comparison: 06/29/2017 chest radiograph.

CLINICAL DATA: Chest pain

EXAM:
CHEST  2 VIEW

[chest pa]
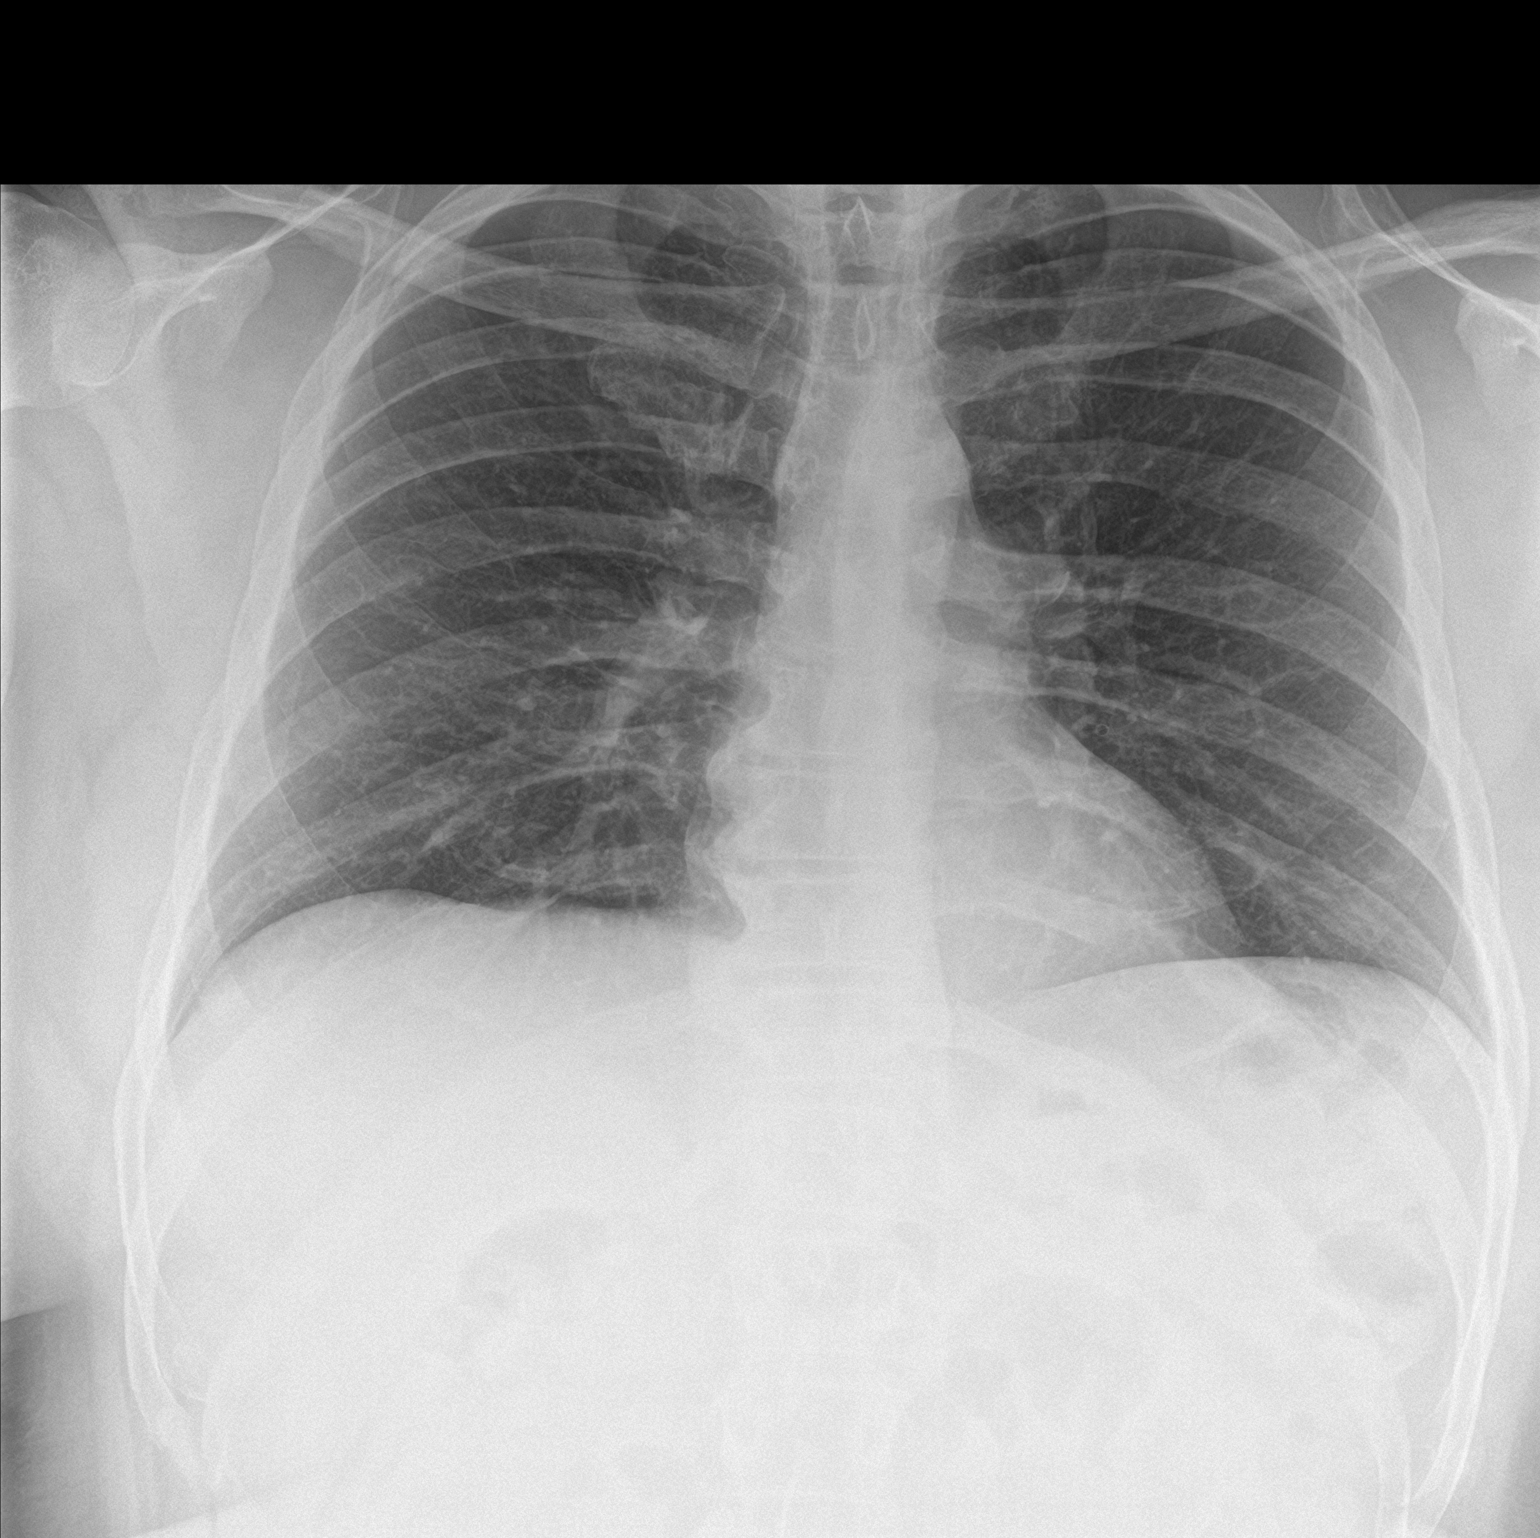

[chest lat]
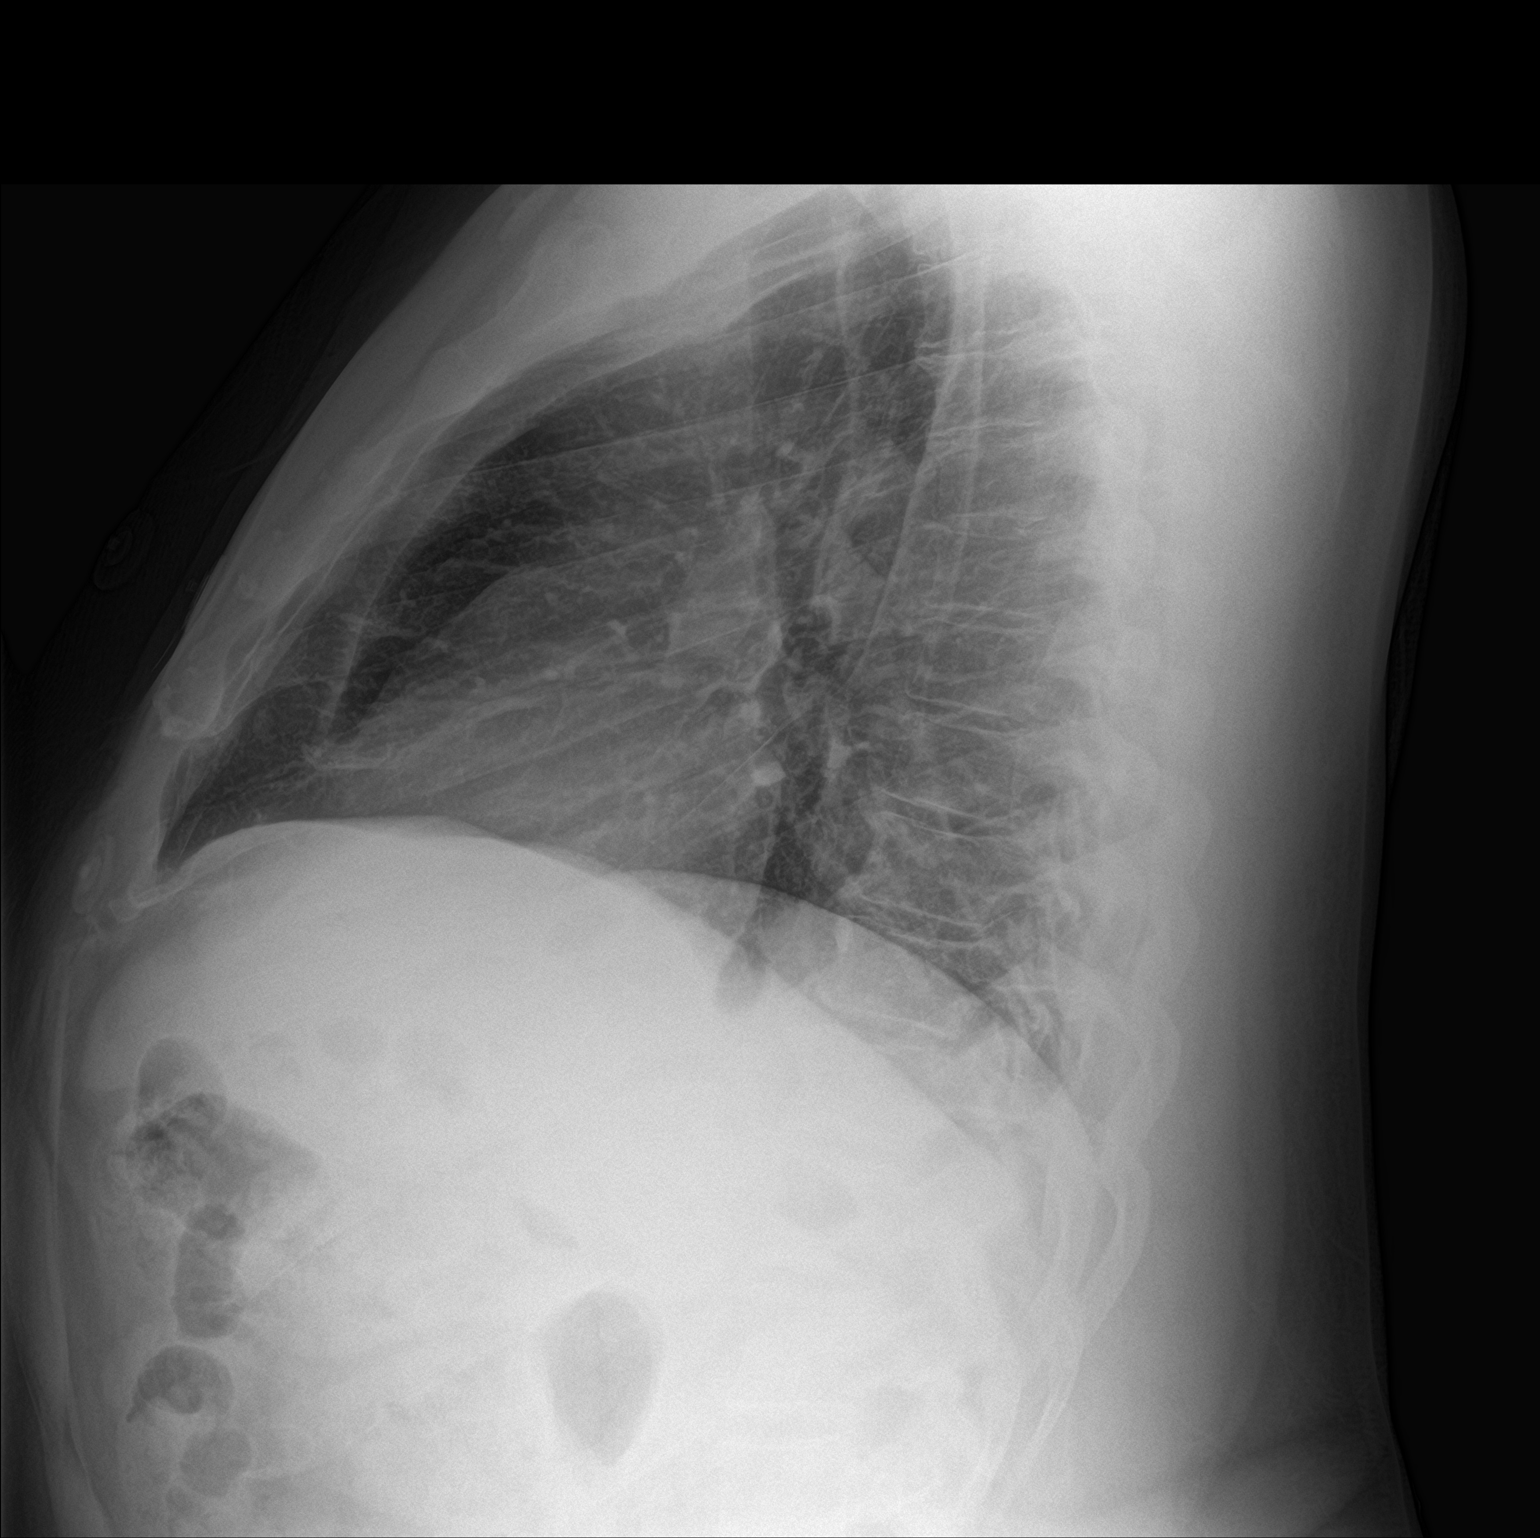

[2 of 2 positions shown; findings below may reference images not displayed]

FINDINGS: Stable cardiomediastinal silhouette with normal heart size. No
pneumothorax. No pleural effusion. Lungs appear clear, with no acute
consolidative airspace disease and no pulmonary edema.
IMPRESSION: No active cardiopulmonary disease.

## 2018-06-15 ENCOUNTER — Ambulatory Visit: Payer: Self-pay

## 2018-06-15 ENCOUNTER — Other Ambulatory Visit: Payer: Self-pay | Admitting: Family Medicine

## 2018-06-15 DIAGNOSIS — S20211A Contusion of right front wall of thorax, initial encounter: Secondary | ICD-10-CM

## 2018-07-14 ENCOUNTER — Other Ambulatory Visit: Payer: Self-pay | Admitting: *Deleted

## 2018-07-14 DIAGNOSIS — K219 Gastro-esophageal reflux disease without esophagitis: Secondary | ICD-10-CM

## 2018-11-14 ENCOUNTER — Emergency Department (HOSPITAL_COMMUNITY)
Admission: EM | Admit: 2018-11-14 | Discharge: 2018-11-14 | Disposition: A | Discharge status: Home / Self Care | Payer: Self-pay | Attending: Emergency Medicine | Admitting: Emergency Medicine

## 2018-11-14 ENCOUNTER — Encounter (HOSPITAL_COMMUNITY): Payer: Self-pay | Admitting: *Deleted

## 2018-11-14 DIAGNOSIS — S299XXD Unspecified injury of thorax, subsequent encounter: Secondary | ICD-10-CM

## 2018-11-14 DIAGNOSIS — Y990 Civilian activity done for income or pay: Secondary | ICD-10-CM | POA: Insufficient documentation

## 2018-11-14 DIAGNOSIS — J45909 Unspecified asthma, uncomplicated: Secondary | ICD-10-CM | POA: Insufficient documentation

## 2018-11-14 DIAGNOSIS — Z79899 Other long term (current) drug therapy: Secondary | ICD-10-CM | POA: Insufficient documentation

## 2018-11-14 DIAGNOSIS — Y929 Unspecified place or not applicable: Secondary | ICD-10-CM | POA: Insufficient documentation

## 2018-11-14 DIAGNOSIS — X509XXA Other and unspecified overexertion or strenuous movements or postures, initial encounter: Secondary | ICD-10-CM | POA: Insufficient documentation

## 2018-11-14 DIAGNOSIS — R0789 Other chest pain: Secondary | ICD-10-CM | POA: Insufficient documentation

## 2018-11-14 DIAGNOSIS — Y939 Activity, unspecified: Secondary | ICD-10-CM | POA: Insufficient documentation

## 2018-11-14 NOTE — Discharge Instructions (Addendum)
For the pain you are having, take ibuprofen 600 mg 3 times a day with meals.  Also use heat on the sore area 3 or 4 times a day.  Avoid heavy lifting or strenuous exertion.

## 2018-11-14 NOTE — ED Triage Notes (Signed)
Pt in c/o right rib pain after an injury a few months ago, was seen at that time and was told he tore a muscle, pain has not improved and he is now having right sided back spasms as well

## 2018-11-14 NOTE — ED Provider Notes (Signed)
[facility] EMERGENCY DEPARTMENT Provider Note   CSN: [id number] Arrival date & time: 11/14/18  1145     History   Chief Complaint Chief Complaint  Patient presents with  . Rib pain    HPI Calvin Clay is a 47 y.o. male.  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  He has had persistent nonstop discomfort in the same area which was aggravated when he restrained another client about a week ago.  He is using ibuprofen and heat to the area without resolution of his pain.  He denies fever, chills, cough, shortness of breath, weakness or dizziness.  There are no other known modifying factors.  Past Medical History:  Diagnosis Date  . Asthma   . GERD (gastroesophageal reflux disease)   . Migraine     Patient Active Problem List   Diagnosis Date Noted  . Chest pain, rule out acute myocardial infarction 08/23/2017  . Tremor 07/27/2017  . Severe persistent asthma without complication 40/97/3532  . Asthma exacerbation 06/30/2017  . Migraine 09/16/2012    Past Surgical History:  Procedure Laterality Date  . COSMETIC SURGERY    . ESOPHAGOGASTRODUODENOSCOPY  09/16/2012   Procedure: ESOPHAGOGASTRODUODENOSCOPY (EGD);  Surgeon: Ladene Artist, MD,FACG;  Location: [facility] ENDOSCOPY;  Service: Endoscopy;  Laterality: N/A;        Home Medications    Prior to Admission medications   Medication Sig Start Date End Date Taking? Authorizing Provider  albuterol (PROVENTIL HFA;VENTOLIN HFA) 108 (90 Base) MCG/ACT inhaler Inhale 2 puffs into the lungs every 6 (six) hours as needed for wheezing or shortness of breath. 07/27/17   Elsie Stain, MD  Aspirin-Salicylamide-Caffeine (BC HEADACHE POWDER PO) Take 1 Package by mouth 3 (three) times daily as needed (headache).    [provider]  budesonide-formoterol (SYMBICORT) 160-4.5 MCG/ACT inhaler Inhale 2  puffs into the lungs 2 (two) times daily. 07/27/17   Elsie Stain, MD  meclizine (ANTIVERT) 25 MG tablet Take 1 tablet (25 mg total) by mouth at bedtime as needed for dizziness. 10/06/17   Isaiah Serge, NP    Family History Family History  Problem Relation Age of Onset  . Colon cancer Mother   . Colon cancer Father     Social History Social History   Tobacco Use  . Smoking status: Never Smoker  . Smokeless tobacco: Never Used  Substance Use Topics  . Alcohol use: Yes    Comment: occasional  . Drug use: No     Allergies   Iodine and Shrimp [shellfish allergy]   Review of Systems Review of Systems  All other systems reviewed and are negative.    Physical Exam Updated Vital Signs BP (!) 153/80 (BP Location: Left Wrist)   Pulse 97   Temp 98.6 F (37 C) (Oral)   Resp 16   SpO2 97%   Physical Exam  Constitutional: He is oriented to person, place, and time. He appears well-developed and well-nourished. No distress.  HENT:  Head: Normocephalic and atraumatic.  Right Ear: External ear normal.  Left Ear: External ear normal.  Eyes: Pupils are equal, round, and reactive to light. Conjunctivae and EOM are normal.  Neck: Normal range of motion and phonation normal. Neck supple.  Cardiovascular: Normal rate.  Pulmonary/Chest: Effort normal. He exhibits tenderness (Mild diffuse right lower chest wall tenderness, both anterior lateral and posterior.  There  is no associated crepitation or deformity.). He exhibits no bony tenderness.  Abdominal: Soft. There is no tenderness.  Musculoskeletal: Normal range of motion.  Neurological: He is alert and oriented to person, place, and time. No cranial nerve deficit or sensory deficit. He exhibits normal muscle tone. Coordination normal.  Skin: Skin is warm, dry and intact.  Psychiatric: He has a normal mood and affect. His behavior is normal. Judgment and thought content normal.  Nursing note and vitals reviewed.    ED  Treatments / Results  Labs (all labs ordered are listed, but only abnormal results are displayed) Labs Reviewed - No data to display  EKG None  Radiology No results found.  Procedures Procedures (including critical care time)  Medications Ordered in ED Medications - No data to display   Initial Impression / Assessment and Plan / ED Course  I have reviewed the triage vital signs and the nursing notes.  Pertinent labs & imaging results that were available during my care of the patient were reviewed by me and considered in my medical decision making (see chart for details).      Patient Vitals for the past 24 hrs:  BP Temp Temp src Pulse Resp SpO2  11/14/18 1149 (!) 153/80 98.6 F (37 C) Oral 97 16 97 %    1:29 PM Reevaluation with update and discussion. After initial assessment and treatment, an updated evaluation reveals no change in clinical status.  Findings discussed with patient, all questions answered. Daleen Bo   Medical Decision Making: Chronic right chest wall pain after injury during a physical restraint maneuver.  Initially seen by occupational health, he has not followed up with them.  Doubt fracture, thoracic radiculopathy or intrathoracic abnormality.  CRITICAL CARE-no Performed by: Daleen Bo  Nursing Notes Reviewed/ Care Coordinated Applicable Imaging Reviewed Interpretation of Laboratory Data incorporated into ED treatment  The patient appears reasonably screened and/or stabilized for discharge and I doubt any other medical condition or other [facility] requiring further screening, evaluation, or treatment in the ED at this time prior to discharge.  Plan: Home Medications-ibuprofen for pain; Home Treatments-to affected area; return here if the recommended treatment, does not improve the symptoms; Recommended follow up-occupational health for ongoing management.   Final Clinical Impressions(s) / ED Diagnoses   Final diagnoses:  Chest wall pain  Injury  of chest wall, subsequent encounter    ED Discharge Orders    None       Daleen Bo, MD 11/14/18 1329

## 2018-12-17 ENCOUNTER — Emergency Department (HOSPITAL_COMMUNITY)
Admission: EM | Admit: 2018-12-17 | Discharge: 2018-12-17 | Disposition: A | Discharge status: Home / Self Care | Payer: Self-pay | Attending: Emergency Medicine | Admitting: Emergency Medicine

## 2018-12-17 ENCOUNTER — Emergency Department (HOSPITAL_COMMUNITY): Payer: Self-pay

## 2018-12-17 ENCOUNTER — Other Ambulatory Visit: Payer: Self-pay

## 2018-12-17 DIAGNOSIS — D72829 Elevated white blood cell count, unspecified: Secondary | ICD-10-CM | POA: Insufficient documentation

## 2018-12-17 DIAGNOSIS — Z79899 Other long term (current) drug therapy: Secondary | ICD-10-CM | POA: Insufficient documentation

## 2018-12-17 DIAGNOSIS — J9811 Atelectasis: Secondary | ICD-10-CM | POA: Insufficient documentation

## 2018-12-17 DIAGNOSIS — R0789 Other chest pain: Secondary | ICD-10-CM | POA: Insufficient documentation

## 2018-12-17 DIAGNOSIS — J45909 Unspecified asthma, uncomplicated: Secondary | ICD-10-CM | POA: Insufficient documentation

## 2018-12-17 LAB — CBC WITH DIFFERENTIAL/PLATELET
Abs Immature Granulocytes: 0.11 10*3/uL — ABNORMAL HIGH (ref 0.00–0.07)
BASOS ABS: 0 10*3/uL (ref 0.0–0.1)
Basophils Relative: 0 %
EOS ABS: 0 10*3/uL (ref 0.0–0.5)
Eosinophils Relative: 0 %
HCT: 47.2 % (ref 39.0–52.0)
Hemoglobin: 14.3 g/dL (ref 13.0–17.0)
IMMATURE GRANULOCYTES: 1 %
Lymphocytes Relative: 12 %
Lymphs Abs: 1.5 10*3/uL (ref 0.7–4.0)
MCH: 26.3 pg (ref 26.0–34.0)
MCHC: 30.3 g/dL (ref 30.0–36.0)
MCV: 86.9 fL (ref 80.0–100.0)
Monocytes Absolute: 0.8 10*3/uL (ref 0.1–1.0)
Monocytes Relative: 6 %
NEUTROS PCT: 81 %
NRBC: 0 % (ref 0.0–0.2)
Neutro Abs: 10.6 10*3/uL — ABNORMAL HIGH (ref 1.7–7.7)
Platelets: 360 10*3/uL (ref 150–400)
RBC: 5.43 MIL/uL (ref 4.22–5.81)
RDW: 14.6 % (ref 11.5–15.5)
WBC: 13 10*3/uL — AB (ref 4.0–10.5)

## 2018-12-17 LAB — COMPREHENSIVE METABOLIC PANEL
ALK PHOS: 75 U/L (ref 38–126)
ALT: 28 U/L (ref 0–44)
AST: 19 U/L (ref 15–41)
Albumin: 3.7 g/dL (ref 3.5–5.0)
Anion gap: 11 (ref 5–15)
BUN: 17 mg/dL (ref 6–20)
CALCIUM: 9.4 mg/dL (ref 8.9–10.3)
CHLORIDE: 104 mmol/L (ref 98–111)
CO2: 20 mmol/L — ABNORMAL LOW (ref 22–32)
Creatinine, Ser: 1.09 mg/dL (ref 0.61–1.24)
Glucose, Bld: 116 mg/dL — ABNORMAL HIGH (ref 70–99)
Potassium: 4.6 mmol/L (ref 3.5–5.1)
SODIUM: 135 mmol/L (ref 135–145)
Total Bilirubin: 0.7 mg/dL (ref 0.3–1.2)
Total Protein: 7.4 g/dL (ref 6.5–8.1)

## 2018-12-17 NOTE — ED Triage Notes (Signed)
Pt reports chronic r side rib pain that radiates to r back. States he was seen here a few weeks ago for same issue.

## 2018-12-17 NOTE — ED Provider Notes (Signed)
[facility] EMERGENCY DEPARTMENT Provider Note   CSN: [id number] Arrival date & time: 12/17/18  6962     History   Chief Complaint Chief Complaint  Patient presents with  . Chest Pain  . Back Pain    HPI Calvin Clay is a 48 y.o. male.  48 year old male presents with ongoing right lower chest wall pain.  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  Patient states that he had to use his arms to push a door open and then was lying on his right side for a period of time while restraining the patient.  Patient denies any specific injury other than he had pain after the incident.  Patient was seen in occupational health at that time, had x-rays of his ribs and was told to take Motrin.  Patient presented to the emergency room 1 month ago with ongoing right rib pain, was evaluated and advised to continue with the Motrin and follow-up with his occupational health provider.  Patient states he missed his January occupational health appointment because he was working but continues to have pain and recently went to a outpatient clinic for same 4 days ago and was given prednisone, Zanaflex, Zithromax.  Patient has asthma, reports underlying cough and wheezing, denies fevers or chills.  Right rib pain is worse with palpation, right lateral bending, coughing and lying on his right side.  No other complaints or concerns.     Past Medical History:  Diagnosis Date  . Asthma   . GERD (gastroesophageal reflux disease)   . Migraine     Patient Active Problem List   Diagnosis Date Noted  . Chest pain, rule out acute myocardial infarction 08/23/2017  . Tremor 07/27/2017  . Severe persistent asthma without complication 95/28/4132  . Asthma exacerbation 06/30/2017  . Migraine 09/16/2012    Past Surgical History:  Procedure Laterality Date  . COSMETIC SURGERY    . ESOPHAGOGASTRODUODENOSCOPY   09/16/2012   Procedure: ESOPHAGOGASTRODUODENOSCOPY (EGD);  Surgeon: Ladene Artist, MD,FACG;  Location: [facility] ENDOSCOPY;  Service: Endoscopy;  Laterality: N/A;        Home Medications    Prior to Admission medications   Medication Sig Start Date End Date Taking? Authorizing Provider  albuterol (PROVENTIL HFA;VENTOLIN HFA) 108 (90 Base) MCG/ACT inhaler Inhale 2 puffs into the lungs every 6 (six) hours as needed for wheezing or shortness of breath. 07/27/17   Elsie Stain, MD  Aspirin-Salicylamide-Caffeine (BC HEADACHE POWDER PO) Take 1 Package by mouth 3 (three) times daily as needed (headache).    [provider]  budesonide-formoterol (SYMBICORT) 160-4.5 MCG/ACT inhaler Inhale 2 puffs into the lungs 2 (two) times daily. 07/27/17   Elsie Stain, MD  meclizine (ANTIVERT) 25 MG tablet Take 1 tablet (25 mg total) by mouth at bedtime as needed for dizziness. 10/06/17   Isaiah Serge, NP    Family History Family History  Problem Relation Age of Onset  . Colon cancer Mother   . Colon cancer Father     Social History Social History   Tobacco Use  . Smoking status: Never Smoker  . Smokeless tobacco: Never Used  Substance Use Topics  . Alcohol use: Yes    Comment: occasional  . Drug use: No     Allergies   Iodine and Shrimp [shellfish allergy]   Review of Systems Review of Systems  Constitutional: Negative for chills and  fever.  Respiratory: Positive for cough.   Musculoskeletal: Positive for back pain.  Allergic/Immunologic: Negative for immunocompromised state.  Neurological: Negative for dizziness and weakness.  Hematological: Does not bruise/bleed easily.  Psychiatric/Behavioral: Negative for confusion.  All other systems reviewed and are negative.    Physical Exam Updated Vital Signs BP (!) 145/79 (BP Location: Right Arm)   Pulse 78   Temp 98.7 F (37.1 C) (Oral)   Resp (!) 22   Ht 6\' 3"  (1.905 m)   Wt 136.1 kg   SpO2 96%   BMI 37.50 kg/m     Physical Exam Vitals signs and nursing note reviewed.  Constitutional:      General: He is not in acute distress.    Appearance: He is well-developed. He is obese. He is not diaphoretic.  HENT:     Head: Normocephalic and atraumatic.  Neck:     Musculoskeletal: Neck supple.  Cardiovascular:     Rate and Rhythm: Normal rate and regular rhythm.     Heart sounds: Normal heart sounds. No murmur.  Pulmonary:     Effort: Pulmonary effort is normal. No tachypnea or respiratory distress.     Breath sounds: Normal breath sounds. No decreased breath sounds.  Chest:     Chest wall: Tenderness present. No deformity or crepitus.       Comments: Patient right lower chest wall extending around posteriorly without midline spine tenderness.  No crepitus, no ecchymosis. Abdominal:     Palpations: Abdomen is soft.     Tenderness: There is no abdominal tenderness.  Musculoskeletal:     Right lower leg: He exhibits no tenderness. No edema.     Left lower leg: He exhibits no tenderness. No edema.  Skin:    General: Skin is warm and dry.  Neurological:     Mental Status: He is alert and oriented to person, place, and time.  Psychiatric:        Behavior: Behavior normal.      ED Treatments / Results  Labs (all labs ordered are listed, but only abnormal results are displayed) Labs Reviewed  COMPREHENSIVE METABOLIC PANEL - Abnormal; Notable for the following components:      Result Value   CO2 20 (*)    Glucose, Bld 116 (*)    All other components within normal limits  CBC WITH DIFFERENTIAL/PLATELET - Abnormal; Notable for the following components:   WBC 13.0 (*)    Neutro Abs 10.6 (*)    Abs Immature Granulocytes 0.11 (*)    All other components within normal limits    EKG None  Radiology Dg Chest 2 View  Result Date: 12/17/2018 CLINICAL DATA:  Chronic right-sided rib pain radiating to the back. EXAM: CHEST - 2 VIEW COMPARISON:  Chest radiograph 06/15/2018 FINDINGS: Normal  cardiac and mediastinal contours. Bibasilar atelectasis. No pleural effusion or pneumothorax. Thoracic spine degenerative changes. IMPRESSION: No acute cardiopulmonary process. Electronically Signed   By: Lovey Newcomer M.D.   On: 12/17/2018 10:12    Procedures Procedures (including critical care time)  Medications Ordered in ED Medications - No data to display   Initial Impression / Assessment and Plan / ED Course  I have reviewed the triage vital signs and the nursing notes.  Pertinent labs & imaging results that were available during my care of the patient were reviewed by me and considered in my medical decision making (see chart for details).  Clinical Course as of Dec 17 1050  Sun Dec 17, 2018  516 48 year old male with past medical history of asthma presents with ongoing right lower chest wall pain after work-related injury which occurred 6 months ago.  Patient was evaluated by occupational health at that time, has been seen in the emergency room within the past month for same.  Patient is taking ibuprofen without improvement.  Patient was recently seen in urgent care and given prescription for prednisone, Zanaflex, Zithromax.  Patient is unsure why the prescription for Zithromax as he did not have any sick symptoms besides asthma related cough.  Exam today patient is tender to his right lower chest wall anteriorly as well as posteriorly without crepitus or ecchymosis.  There is no rash to suggest shingles.  Chest x-ray shows bibasilar atelectasis, suspect this is due to not taking full deep breaths possibly secondary to his pain.  CMP without significant findings, specifically liver enzymes within normal limits.  CBC with mildly elevated white count at 13,000 likely secondary to the current prednisone Dosepak he is on.  Patient given incentive spirometer to help with his atelectasis and referred back to his Worker's Comp. provider for further treatment and evaluation.   [LM]    Clinical  Course User Index [LM] Tacy Learn, PA-C   Final Clinical Impressions(s) / ED Diagnoses   Final diagnoses:  Chest wall pain  Atelectasis    ED Discharge Orders    None       Roque Lias 12/17/18 1052    Daleen Bo, MD 12/18/18 1050

## 2018-12-17 NOTE — ED Notes (Signed)
Patient transported to X-ray 

## 2018-12-17 NOTE — Discharge Instructions (Addendum)
Apply over-the-counter lidocaine patches to your chest wall to help with pain. Use incentive spirometer as instructed to help ensure deep breaths and prevent development of pneumonia. Follow-up with your Worker's Comp. provider for further evaluation and treatment.

## 2018-12-17 NOTE — ED Notes (Signed)
Pt instructed on use of incentive spirometer, verbalized understanding of dc instructions. Vss, ambulatory upon dc, nad

## 2019-02-16 ENCOUNTER — Other Ambulatory Visit: Payer: Self-pay | Admitting: Sports Medicine

## 2019-02-16 DIAGNOSIS — M546 Pain in thoracic spine: Secondary | ICD-10-CM

## 2019-02-21 ENCOUNTER — Other Ambulatory Visit: Payer: Self-pay | Admitting: Sports Medicine

## 2019-02-23 ENCOUNTER — Ambulatory Visit
Admission: RE | Admit: 2019-02-23 | Discharge: 2019-02-23 | Disposition: A | Discharge status: Home / Self Care | Payer: BLUE CROSS/BLUE SHIELD | Source: Ambulatory Visit | Attending: Sports Medicine | Admitting: Sports Medicine

## 2019-02-23 ENCOUNTER — Other Ambulatory Visit: Payer: Self-pay

## 2019-02-23 DIAGNOSIS — M546 Pain in thoracic spine: Secondary | ICD-10-CM

## 2019-04-24 ENCOUNTER — Other Ambulatory Visit: Payer: BLUE CROSS/BLUE SHIELD

## 2019-04-27 ENCOUNTER — Other Ambulatory Visit: Payer: Self-pay

## 2019-04-27 ENCOUNTER — Ambulatory Visit
Admission: RE | Admit: 2019-04-27 | Discharge: 2019-04-27 | Disposition: A | Discharge status: Home / Self Care | Payer: BLUE CROSS/BLUE SHIELD | Source: Ambulatory Visit | Attending: Sports Medicine | Admitting: Sports Medicine

## 2019-04-27 DIAGNOSIS — M546 Pain in thoracic spine: Secondary | ICD-10-CM

## 2020-01-14 ENCOUNTER — Encounter (HOSPITAL_COMMUNITY): Payer: Self-pay | Admitting: Emergency Medicine

## 2020-01-14 ENCOUNTER — Other Ambulatory Visit: Payer: Self-pay

## 2020-01-14 ENCOUNTER — Emergency Department (HOSPITAL_COMMUNITY)
Admission: EM | Admit: 2020-01-14 | Discharge: 2020-01-14 | Disposition: A | Discharge status: Home / Self Care | Payer: BC Managed Care – PPO | Attending: Emergency Medicine | Admitting: Emergency Medicine

## 2020-01-14 ENCOUNTER — Emergency Department (HOSPITAL_COMMUNITY): Payer: BC Managed Care – PPO

## 2020-01-14 DIAGNOSIS — N200 Calculus of kidney: Secondary | ICD-10-CM | POA: Diagnosis not present

## 2020-01-14 DIAGNOSIS — N201 Calculus of ureter: Secondary | ICD-10-CM

## 2020-01-14 DIAGNOSIS — K5730 Diverticulosis of large intestine without perforation or abscess without bleeding: Secondary | ICD-10-CM | POA: Diagnosis not present

## 2020-01-14 DIAGNOSIS — Z79899 Other long term (current) drug therapy: Secondary | ICD-10-CM | POA: Diagnosis not present

## 2020-01-14 DIAGNOSIS — R109 Unspecified abdominal pain: Secondary | ICD-10-CM

## 2020-01-14 DIAGNOSIS — J45909 Unspecified asthma, uncomplicated: Secondary | ICD-10-CM | POA: Diagnosis not present

## 2020-01-14 LAB — COMPREHENSIVE METABOLIC PANEL
ALT: 44 U/L (ref 0–44)
AST: 35 U/L (ref 15–41)
Albumin: 3.5 g/dL (ref 3.5–5.0)
Alkaline Phosphatase: 72 U/L (ref 38–126)
Anion gap: 11 (ref 5–15)
BUN: 10 mg/dL (ref 6–20)
CO2: 25 mmol/L (ref 22–32)
Calcium: 9.3 mg/dL (ref 8.9–10.3)
Chloride: 103 mmol/L (ref 98–111)
Creatinine, Ser: 1.19 mg/dL (ref 0.61–1.24)
GFR calc Af Amer: >60 mL/min (ref >60)
GFR calc non Af Amer: >60 mL/min (ref >60)
Glucose, Bld: 93 mg/dL (ref 70–99)
Potassium: 4.2 mmol/L (ref 3.5–5.1)
Sodium: 139 mmol/L (ref 135–145)
Total Bilirubin: 0.4 mg/dL (ref 0.3–1.2)
Total Protein: 7.2 g/dL (ref 6.5–8.1)

## 2020-01-14 LAB — CBC
HCT: 42 % (ref 39.0–52.0)
Hemoglobin: 14.1 g/dL (ref 13.0–17.0)
MCH: 29.5 pg (ref 26.0–34.0)
MCHC: 33.6 g/dL (ref 30.0–36.0)
MCV: 87.9 fL (ref 80.0–100.0)
Platelets: 367 10*3/uL (ref 150–400)
RBC: 4.78 MIL/uL (ref 4.22–5.81)
RDW: 14 % (ref 11.5–15.5)
WBC: 7.7 10*3/uL (ref 4.0–10.5)
nRBC: 0 % (ref 0.0–0.2)

## 2020-01-14 LAB — URINALYSIS, ROUTINE W REFLEX MICROSCOPIC
Bilirubin Urine: NEGATIVE
Glucose, UA: NEGATIVE mg/dL
Ketones, ur: NEGATIVE mg/dL
Leukocytes,Ua: NEGATIVE
Nitrite: NEGATIVE
Protein, ur: NEGATIVE mg/dL
Specific Gravity, Urine: 1.023 (ref 1.005–1.030)
pH: 5 (ref 5.0–8.0)

## 2020-01-14 LAB — LIPASE, BLOOD: Lipase: 30 U/L (ref 11–51)

## 2020-01-14 MED ORDER — ONDANSETRON HCL 4 MG/2ML IJ SOLN
4.0000 mg | Freq: Once | INTRAMUSCULAR | Status: AC
  Administered 2020-01-14: 4 mg via INTRAVENOUS
  Filled 2020-01-14: qty 2

## 2020-01-14 MED ORDER — ONDANSETRON HCL 4 MG/2ML IJ SOLN
4.0000 mg | Freq: Once | INTRAMUSCULAR | Status: DC
Start: 2020-01-14 — End: 2020-01-14

## 2020-01-14 MED ORDER — HYDROMORPHONE HCL 1 MG/ML IJ SOLN: 1.0000 mg | Freq: Once | INTRAMUSCULAR | Status: DC

## 2020-01-14 MED ORDER — HYDROMORPHONE HCL 1 MG/ML IJ SOLN
1.0000 mg | Freq: Once | INTRAMUSCULAR | Status: AC
  Administered 2020-01-14: 1 mg via INTRAMUSCULAR
  Filled 2020-01-14: qty 1

## 2020-01-14 NOTE — ED Notes (Signed)
Pt was discharged from the ED. Pt read and understood discharge paperwork. Pt had vital signs completed. E-signature unavailable. Pt conscious, breathing, and A&Ox4. No distress noted. Pt speaking in complete sentences. Pt brought out of the ED via wheelchair.

## 2020-01-14 NOTE — ED Notes (Signed)
Pt to CT via cart.

## 2020-01-14 NOTE — ED Provider Notes (Signed)
[place] EMERGENCY DEPARTMENT Provider Note   CSN: [id number] Arrival date & time: 01/14/20  1455     History Chief Complaint  Patient presents with  . Flank Pain  . Abdominal Pain    Calvin Clay is a 49 y.o. male.  Patient c/o severe right flank pain acute onset this afternoon at rest. Symptoms acute onset, severe, constant, non radiating, dull, waxing and waning in severity. Remote hx kidney stone - unsure if same. No hx gallstones. No fever or chills. Nausea. No vomiting. No dysuria or hematuria. States had covid last month, no current cough or respiratory symptoms.   The history is provided by the patient.  Flank Pain Associated symptoms include abdominal pain. Pertinent negatives include no chest pain, no headaches and no shortness of breath.  Abdominal Pain Associated symptoms: nausea   Associated symptoms: no chest pain, no constipation, no cough, no diarrhea, no dysuria, no fever, no hematuria, no shortness of breath, no sore throat and no vomiting        Past Medical History:  Diagnosis Date  . Asthma   . GERD (gastroesophageal reflux disease)   . Migraine     Patient Active Problem List   Diagnosis Date Noted  . Chest pain, rule out acute myocardial infarction 08/23/2017  . Tremor 07/27/2017  . Severe persistent asthma without complication 123456  . Asthma exacerbation 06/30/2017  . Migraine 09/16/2012    Past Surgical History:  Procedure Laterality Date  . COSMETIC SURGERY    . ESOPHAGOGASTRODUODENOSCOPY  09/16/2012   Procedure: ESOPHAGOGASTRODUODENOSCOPY (EGD);  Surgeon: Ladene Artist, MD,FACG;  Location: [facility] ENDOSCOPY;  Service: Endoscopy;  Laterality: N/A;       Family History  Problem Relation Age of Onset  . Colon cancer Mother   . Colon cancer Father     Social History   Tobacco Use  . Smoking status: Never Smoker  . Smokeless tobacco: Never Used  Substance Use Topics  . Alcohol use: Yes    Comment:  occasional  . Drug use: No    Home Medications Prior to Admission medications   Medication Sig Start Date End Date Taking? Authorizing Provider  albuterol (PROVENTIL HFA;VENTOLIN HFA) 108 (90 Base) MCG/ACT inhaler Inhale 2 puffs into the lungs every 6 (six) hours as needed for wheezing or shortness of breath. 07/27/17   Elsie Stain, MD  Aspirin-Salicylamide-Caffeine (BC HEADACHE POWDER PO) Take 1 Package by mouth 3 (three) times daily as needed (headache).    [provider]  budesonide-formoterol (SYMBICORT) 160-4.5 MCG/ACT inhaler Inhale 2 puffs into the lungs 2 (two) times daily. 07/27/17   Elsie Stain, MD  meclizine (ANTIVERT) 25 MG tablet Take 1 tablet (25 mg total) by mouth at bedtime as needed for dizziness. 10/06/17   Isaiah Serge, NP    Allergies    Iodine and Shrimp [shellfish allergy]  Review of Systems   Review of Systems  Constitutional: Negative for fever.  HENT: Negative for sore throat.   Eyes: Negative for redness.  Respiratory: Negative for cough and shortness of breath.   Cardiovascular: Negative for chest pain.  Gastrointestinal: Positive for abdominal pain and nausea. Negative for constipation, diarrhea and vomiting.  Genitourinary: Positive for flank pain. Negative for dysuria, hematuria and testicular pain.  Musculoskeletal: Negative for back pain and neck pain.  Skin: Negative for rash.  Neurological: Negative for headaches.  Hematological: Does not bruise/bleed easily.  Psychiatric/Behavioral: Negative for confusion.    Physical Exam Updated Vital Signs  BP 135/86 (BP Location: Left Arm)   Pulse (!) 110   Temp 98.6 F (37 C) (Oral)   Resp 16   Ht 1.905 m (6\' 3" )   Wt 136.1 kg   SpO2 97%   BMI 37.50 kg/m   Physical Exam Vitals and nursing note reviewed.  Constitutional:      Appearance: Normal appearance. He is well-developed.  HENT:     Head: Atraumatic.     Nose: Nose normal.     Mouth/Throat:     Mouth: Mucous  membranes are moist.     Pharynx: Oropharynx is clear.  Eyes:     General: No scleral icterus.    Conjunctiva/sclera: Conjunctivae normal.  Neck:     Trachea: No tracheal deviation.  Cardiovascular:     Rate and Rhythm: Normal rate and regular rhythm.     Pulses: Normal pulses.     Heart sounds: Normal heart sounds. No murmur. No friction rub. No gallop.   Pulmonary:     Effort: Pulmonary effort is normal. No accessory muscle usage or respiratory distress.     Breath sounds: Normal breath sounds.  Abdominal:     General: Bowel sounds are normal. There is no distension.     Palpations: Abdomen is soft. There is no mass.     Tenderness: There is no abdominal tenderness. There is no guarding or rebound.     Hernia: No hernia is present.  Genitourinary:    Comments: No cva tenderness. Musculoskeletal:        General: No swelling.     Cervical back: Normal range of motion and neck supple. No rigidity.  Skin:    General: Skin is warm and dry.     Findings: No rash.  Neurological:     Mental Status: He is alert.     Comments: Alert, speech clear.   Psychiatric:        Mood and Affect: Mood normal.     ED Results / Procedures / Treatments   Labs (all labs ordered are listed, but only abnormal results are displayed) Results for orders placed or performed during the hospital encounter of 01/14/20  Lipase, blood  Result Value Ref Range   Lipase 30 11 - 51 U/L  Comprehensive metabolic panel  Result Value Ref Range   Sodium 139 135 - 145 mmol/L   Potassium 4.2 3.5 - 5.1 mmol/L   Chloride 103 98 - 111 mmol/L   CO2 25 22 - 32 mmol/L   Glucose, Bld 93 70 - 99 mg/dL   BUN 10 6 - 20 mg/dL   Creatinine, Ser 1.19 0.61 - 1.24 mg/dL   Calcium 9.3 8.9 - 10.3 mg/dL   Total Protein 7.2 6.5 - 8.1 g/dL   Albumin 3.5 3.5 - 5.0 g/dL   AST 35 15 - 41 U/L   ALT 44 0 - 44 U/L   Alkaline Phosphatase 72 38 - 126 U/L   Total Bilirubin 0.4 0.3 - 1.2 mg/dL   GFR calc non Af Amer >60 >60 mL/min     GFR calc Af Amer >60 >60 mL/min   Anion gap 11 5 - 15  CBC  Result Value Ref Range   WBC 7.7 4.0 - 10.5 K/uL   RBC 4.78 4.22 - 5.81 MIL/uL   Hemoglobin 14.1 13.0 - 17.0 g/dL   HCT 42.0 39.0 - 52.0 %   MCV 87.9 80.0 - 100.0 fL   MCH 29.5 26.0 - 34.0 pg   MCHC 33.6  30.0 - 36.0 g/dL   RDW 14.0 11.5 - 15.5 %   Platelets 367 150 - 400 K/uL   nRBC 0.0 0.0 - 0.2 %  Urinalysis, Routine w reflex microscopic  Result Value Ref Range   Color, Urine YELLOW YELLOW   APPearance CLEAR CLEAR   Specific Gravity, Urine 1.023 1.005 - 1.030   pH 5.0 5.0 - 8.0   Glucose, UA NEGATIVE NEGATIVE mg/dL   Hgb urine dipstick MODERATE (A) NEGATIVE   Bilirubin Urine NEGATIVE NEGATIVE   Ketones, ur NEGATIVE NEGATIVE mg/dL   Protein, ur NEGATIVE NEGATIVE mg/dL   Nitrite NEGATIVE NEGATIVE   Leukocytes,Ua NEGATIVE NEGATIVE   RBC / HPF 6-10 0 - 5 RBC/hpf   WBC, UA 0-5 0 - 5 WBC/hpf   Bacteria, UA RARE (A) NONE SEEN   Squamous Epithelial / LPF 0-5 0 - 5   Mucus PRESENT     EKG None  Radiology CT Renal Stone Study  Addendum Date: 01/14/2020   ADDENDUM REPORT: 01/14/2020 19:49 ADDENDUM: Please note there is findings of multifocal pneumonia, possibly viral or atypical in etiology. Clinical correlation is recommended. Electronically Signed   By: Anner Crete M.D.   On: 01/14/2020 19:49   Result Date: 01/14/2020 CLINICAL DATA:  49 year old male with flank pain. Concern for kidney stone. EXAM: CT ABDOMEN AND PELVIS WITHOUT CONTRAST TECHNIQUE: Multidetector CT imaging of the abdomen and pelvis was performed following the standard protocol without IV contrast. COMPARISON:  CT abdomen pelvis dated 09/27/2015. FINDINGS: Evaluation of this exam is limited in the absence of intravenous contrast. Lower chest: Bilateral faint clusters of ground-glass opacity in the visualized lung bases most consistent with multifocal pneumonia, likely atypical or viral in etiology. Clinical correlation is recommended. No  intra-abdominal free air or free fluid. Hepatobiliary: Diffuse fatty infiltration of the liver. No intrahepatic biliary ductal dilatation. The gallbladder is unremarkable. Pancreas: Unremarkable. No pancreatic ductal dilatation or surrounding inflammatory changes. Spleen: Normal in size without focal abnormality. Adrenals/Urinary Tract: The adrenal glands are unremarkable. There is a 5 mm stone along the posterior wall of the urinary bladder, likely a recently passed right renal calculus and less likely a right UVJ stone. There is mild right hydronephrosis. No stone identified in the right kidney or ureter. There is a 6 mm nonobstructing left renal interpolar calculus. There is no hydronephrosis on the left. Ill-defined left renal interpolar 15 mm hypodense lesion is not characterized on this CT but most likely represents a cyst. Stomach/Bowel: There is sigmoid diverticulosis and scattered colonic diverticula without active inflammatory changes. There is no bowel obstruction or active inflammation. The appendix is normal. Vascular/Lymphatic: The abdominal aorta and IVC are grossly unremarkable. No portal venous gas. There is no adenopathy. Reproductive: The prostate and seminal vesicles are grossly unremarkable. Other: None Musculoskeletal: Multilevel facet arthropathy. No acute osseous pathology. IMPRESSION: 1. Mild right hydronephrosis secondary to a recently passed right renal calculus within the urinary bladder. 2. Nonobstructing left renal calculus. No hydronephrosis on the left. 3. Colonic diverticulosis. No bowel obstruction or active inflammation. Normal appendix. 4. Fatty liver. Electronically Signed: By: Anner Crete M.D. On: 01/14/2020 19:35    Procedures Procedures (including critical care time)  Medications Ordered in ED Medications  HYDROmorphone (DILAUDID) injection 1 mg (has no administration in time range)  ondansetron (ZOFRAN) injection 4 mg (has no administration in time range)     ED Course  I have reviewed the triage vital signs and the nursing notes.  Pertinent labs & imaging results that  were available during my care of the patient were reviewed by me and considered in my medical decision making (see chart for details).    MDM Rules/Calculators/A&P                     Iv ns. Dilaudid 1 mg iv. zofran iv. Labs sent.  Reviewed nursing notes and prior charts for additional history.   Labs reviewed/interpreted by me - chem normal. UA with 6-10 rbc.   CT pending.   CT reviewed/interpreted by me - right hydro, ?recently passed ureteral stone in bladder. Also incidental note left kidney stone and diverticula. Above findings relayed to pt.   Recheck pt - no pain. abd soft nt. Hr 88, rr 14. Pain resolved. Patient appears stable for d/c.   Return precautions provided.       Final Clinical Impression(s) / ED Diagnoses Final diagnoses:  None    Rx / DC Orders ED Discharge Orders    None       Lajean Saver, MD 01/14/20 2058

## 2020-01-14 NOTE — ED Notes (Signed)
Pt returned from CT °

## 2020-01-14 NOTE — ED Triage Notes (Signed)
Pt states he has had intermittent abd pain since recovering from covid. Reports constant right side pressure that radiates to front of abd. Also reports a tingling sensation in his urethra but denies any discharge or pain.

## 2020-01-14 NOTE — Discharge Instructions (Signed)
It was our pleasure to provide your ER care today - we hope that you feel better.  Your pain appears to be the result of a right sided kidney stone, that now appears to have passed into your bladder. Incidental note was also made on your scan of a left sided kidney stone, diverticula of colon, and changes in lung consistent with your recent covid pneumonia.   Drink plenty of fluids. Strain urine.   Follow up with your doctor.   Return to ER if worse, new symptoms, fevers, new or worsening pain, or other concern.  You were given pain medication in the ER - no driving for the next 6 hours.

## 2020-01-17 ENCOUNTER — Emergency Department (HOSPITAL_COMMUNITY): Payer: BC Managed Care – PPO

## 2020-01-17 ENCOUNTER — Observation Stay (HOSPITAL_BASED_OUTPATIENT_CLINIC_OR_DEPARTMENT_OTHER): Payer: BC Managed Care – PPO

## 2020-01-17 ENCOUNTER — Encounter (HOSPITAL_COMMUNITY): Payer: Self-pay | Admitting: Emergency Medicine

## 2020-01-17 ENCOUNTER — Other Ambulatory Visit: Payer: Self-pay

## 2020-01-17 ENCOUNTER — Observation Stay (HOSPITAL_COMMUNITY)
Admission: EM | Admit: 2020-01-17 | Discharge: 2020-01-18 | Disposition: A | Discharge status: Home / Self Care | Payer: BC Managed Care – PPO | Attending: Student in an Organized Health Care Education/Training Program | Admitting: Student in an Organized Health Care Education/Training Program

## 2020-01-17 DIAGNOSIS — F419 Anxiety disorder, unspecified: Secondary | ICD-10-CM | POA: Insufficient documentation

## 2020-01-17 DIAGNOSIS — J9601 Acute respiratory failure with hypoxia: Secondary | ICD-10-CM | POA: Diagnosis not present

## 2020-01-17 DIAGNOSIS — E669 Obesity, unspecified: Secondary | ICD-10-CM | POA: Diagnosis not present

## 2020-01-17 DIAGNOSIS — I2699 Other pulmonary embolism without acute cor pulmonale: Secondary | ICD-10-CM

## 2020-01-17 DIAGNOSIS — Z7951 Long term (current) use of inhaled steroids: Secondary | ICD-10-CM | POA: Insufficient documentation

## 2020-01-17 DIAGNOSIS — R000 Tachycardia, unspecified: Secondary | ICD-10-CM | POA: Diagnosis not present

## 2020-01-17 DIAGNOSIS — R0689 Other abnormalities of breathing: Secondary | ICD-10-CM

## 2020-01-17 DIAGNOSIS — N200 Calculus of kidney: Secondary | ICD-10-CM | POA: Diagnosis not present

## 2020-01-17 DIAGNOSIS — Z8616 Personal history of COVID-19: Secondary | ICD-10-CM | POA: Diagnosis not present

## 2020-01-17 DIAGNOSIS — D649 Anemia, unspecified: Secondary | ICD-10-CM | POA: Diagnosis not present

## 2020-01-17 DIAGNOSIS — J45909 Unspecified asthma, uncomplicated: Secondary | ICD-10-CM

## 2020-01-17 DIAGNOSIS — U071 COVID-19: Secondary | ICD-10-CM | POA: Diagnosis not present

## 2020-01-17 DIAGNOSIS — N179 Acute kidney failure, unspecified: Secondary | ICD-10-CM | POA: Diagnosis not present

## 2020-01-17 DIAGNOSIS — I2693 Single subsegmental pulmonary embolism without acute cor pulmonale: Secondary | ICD-10-CM | POA: Diagnosis not present

## 2020-01-17 DIAGNOSIS — Z91013 Allergy to seafood: Secondary | ICD-10-CM

## 2020-01-17 DIAGNOSIS — R079 Chest pain, unspecified: Secondary | ICD-10-CM | POA: Diagnosis present

## 2020-01-17 DIAGNOSIS — Z91048 Other nonmedicinal substance allergy status: Secondary | ICD-10-CM

## 2020-01-17 DIAGNOSIS — R0902 Hypoxemia: Secondary | ICD-10-CM

## 2020-01-17 LAB — CBC
HCT: 43.5 % (ref 39.0–52.0)
Hemoglobin: 14.2 g/dL (ref 13.0–17.0)
MCH: 27.1 pg (ref 26.0–34.0)
MCHC: 32.6 g/dL (ref 30.0–36.0)
MCV: 83 fL (ref 80.0–100.0)
Platelets: 401 10*3/uL — ABNORMAL HIGH (ref 150–400)
RBC: 5.24 MIL/uL (ref 4.22–5.81)
RDW: 13.5 % (ref 11.5–15.5)
WBC: 9.7 10*3/uL (ref 4.0–10.5)
nRBC: 0 % (ref 0.0–0.2)

## 2020-01-17 LAB — HIV ANTIBODY (ROUTINE TESTING W REFLEX): HIV Screen 4th Generation wRfx: NONREACTIVE

## 2020-01-17 LAB — HEPATIC FUNCTION PANEL
ALT: 34 U/L (ref 0–44)
AST: 38 U/L (ref 15–41)
Albumin: 3.6 g/dL (ref 3.5–5.0)
Alkaline Phosphatase: 69 U/L (ref 38–126)
Bilirubin, Direct: 0.5 mg/dL — ABNORMAL HIGH (ref 0.0–0.2)
Indirect Bilirubin: 1.3 mg/dL — ABNORMAL HIGH (ref 0.3–0.9)
Total Bilirubin: 1.8 mg/dL — ABNORMAL HIGH (ref 0.3–1.2)
Total Protein: 7.8 g/dL (ref 6.5–8.1)

## 2020-01-17 LAB — URINALYSIS, ROUTINE W REFLEX MICROSCOPIC
Bacteria, UA: NONE SEEN
Bilirubin Urine: NEGATIVE
Glucose, UA: NEGATIVE mg/dL
Ketones, ur: NEGATIVE mg/dL
Leukocytes,Ua: NEGATIVE
Nitrite: NEGATIVE
Protein, ur: NEGATIVE mg/dL
Specific Gravity, Urine: 1.033 — ABNORMAL HIGH (ref 1.005–1.030)
pH: 7 (ref 5.0–8.0)

## 2020-01-17 LAB — BASIC METABOLIC PANEL
Anion gap: 11 (ref 5–15)
BUN: 11 mg/dL (ref 6–20)
CO2: 25 mmol/L (ref 22–32)
Calcium: 9.7 mg/dL (ref 8.9–10.3)
Chloride: 102 mmol/L (ref 98–111)
Creatinine, Ser: 1.44 mg/dL — ABNORMAL HIGH (ref 0.61–1.24)
GFR calc Af Amer: >60 mL/min (ref >60)
GFR calc non Af Amer: 57 mL/min — ABNORMAL LOW (ref >60)
Glucose, Bld: 104 mg/dL — ABNORMAL HIGH (ref 70–99)
Potassium: 4.1 mmol/L (ref 3.5–5.1)
Sodium: 138 mmol/L (ref 135–145)

## 2020-01-17 LAB — PROTIME-INR
INR: 1.1 (ref 0.8–1.2)
Prothrombin Time: 14 seconds (ref 11.4–15.2)

## 2020-01-17 LAB — TROPONIN I (HIGH SENSITIVITY)
Troponin I (High Sensitivity): 5 ng/L (ref <18)
Troponin I (High Sensitivity): 3 ng/L (ref <18)
Troponin I (High Sensitivity): 5 ng/L (ref <18)

## 2020-01-17 LAB — HEPARIN LEVEL (UNFRACTIONATED): Heparin Unfractionated: 0.74 IU/mL — ABNORMAL HIGH (ref 0.30–0.70)

## 2020-01-17 LAB — ECHOCARDIOGRAM COMPLETE

## 2020-01-17 LAB — D-DIMER, QUANTITATIVE: D-Dimer, Quant: 7.19 ug/mL-FEU — ABNORMAL HIGH (ref 0.00–0.50)

## 2020-01-17 LAB — SARS CORONAVIRUS 2 (TAT 6-24 HRS): SARS Coronavirus 2: POSITIVE — AB

## 2020-01-17 MED ORDER — MOMETASONE FURO-FORMOTEROL FUM 200-5 MCG/ACT IN AERO
2.0000 | INHALATION_SPRAY | Freq: Two times a day (BID) | RESPIRATORY_TRACT | Status: DC
  Administered 2020-01-17 – 2020-01-18 (×2): 2 via RESPIRATORY_TRACT
  Filled 2020-01-17: qty 8.8

## 2020-01-17 MED ORDER — DIPHENHYDRAMINE HCL 25 MG PO CAPS
50.0000 mg | ORAL_CAPSULE | Freq: Once | ORAL | Status: AC
  Administered 2020-01-17: 06:00:00 50 mg via ORAL
  Filled 2020-01-17: qty 2

## 2020-01-17 MED ORDER — SODIUM CHLORIDE 0.9% FLUSH
3.0000 mL | Freq: Once | INTRAVENOUS | Status: AC
  Administered 2020-01-17: 3 mL via INTRAVENOUS

## 2020-01-17 MED ORDER — HYDROCORTISONE NA SUCCINATE PF 250 MG IJ SOLR
200.0000 mg | Freq: Once | INTRAMUSCULAR | Status: DC
  Filled 2020-01-17: qty 200

## 2020-01-17 MED ORDER — LORAZEPAM 2 MG/ML IJ SOLN
1.0000 mg | Freq: Once | INTRAMUSCULAR | Status: AC
  Administered 2020-01-17: 04:00:00 1 mg via INTRAVENOUS
  Filled 2020-01-17: qty 1

## 2020-01-17 MED ORDER — HYDROCORTISONE NA SUCCINATE PF 250 MG IJ SOLR
200.0000 mg | Freq: Once | INTRAMUSCULAR | Status: AC
  Administered 2020-01-17: 04:00:00 200 mg via INTRAVENOUS

## 2020-01-17 MED ORDER — LORAZEPAM 2 MG/ML IJ SOLN
0.5000 mg | Freq: Three times a day (TID) | INTRAMUSCULAR | Status: DC | PRN
  Administered 2020-01-17: 21:00:00 0.5 mg via INTRAVENOUS
  Filled 2020-01-17: qty 1

## 2020-01-17 MED ORDER — ACETAMINOPHEN 325 MG PO TABS: 650.0000 mg | ORAL_TABLET | Freq: Four times a day (QID) | ORAL | Status: DC | PRN

## 2020-01-17 MED ORDER — HEPARIN BOLUS VIA INFUSION
6000.0000 [IU] | Freq: Once | INTRAVENOUS | Status: AC
  Administered 2020-01-17: 6000 [IU] via INTRAVENOUS
  Filled 2020-01-17: qty 6000

## 2020-01-17 MED ORDER — HEPARIN (PORCINE) 25000 UT/250ML-% IV SOLN
1700.0000 [IU]/h | INTRAVENOUS | Status: AC
  Administered 2020-01-17: 1800 [IU]/h via INTRAVENOUS
  Filled 2020-01-17 (×3): qty 250

## 2020-01-17 MED ORDER — ONDANSETRON HCL 4 MG/2ML IJ SOLN: 4.0000 mg | Freq: Four times a day (QID) | INTRAMUSCULAR | Status: DC | PRN

## 2020-01-17 MED ORDER — SODIUM CHLORIDE 0.9 % IV BOLUS
500.0000 mL | Freq: Once | INTRAVENOUS | Status: AC
  Administered 2020-01-17: 09:00:00 500 mL via INTRAVENOUS

## 2020-01-17 MED ORDER — IOHEXOL 350 MG/ML SOLN
80.0000 mL | Freq: Once | INTRAVENOUS | Status: AC | PRN
  Administered 2020-01-17: 08:00:00 80 mL via INTRAVENOUS

## 2020-01-17 MED ORDER — FENTANYL CITRATE (PF) 100 MCG/2ML IJ SOLN
50.0000 ug | Freq: Once | INTRAMUSCULAR | Status: AC
  Administered 2020-01-17: 09:00:00 50 ug via INTRAVENOUS
  Filled 2020-01-17: qty 2

## 2020-01-17 MED ORDER — DIPHENHYDRAMINE HCL 50 MG/ML IJ SOLN: 50.0000 mg | Freq: Once | INTRAMUSCULAR | Status: AC

## 2020-01-17 MED ORDER — ALBUTEROL SULFATE HFA 108 (90 BASE) MCG/ACT IN AERS
2.0000 | INHALATION_SPRAY | Freq: Four times a day (QID) | RESPIRATORY_TRACT | Status: DC | PRN
  Filled 2020-01-17: qty 6.7

## 2020-01-17 MED ORDER — SODIUM CHLORIDE 0.9 % IV SOLN: INTRAVENOUS | Status: AC

## 2020-01-17 MED ORDER — ONDANSETRON HCL 4 MG PO TABS: 4.0000 mg | ORAL_TABLET | Freq: Four times a day (QID) | ORAL | Status: DC | PRN

## 2020-01-17 MED ORDER — ACETAMINOPHEN 650 MG RE SUPP: 650.0000 mg | Freq: Four times a day (QID) | RECTAL | Status: DC | PRN

## 2020-01-17 NOTE — ED Provider Notes (Addendum)
Care handoff received from Calvin Mail, PA-C at shift change please see her note for full details of visit.  In short 49 year old male presents today with pleuritic chest pain tachypnea and tachycardia.  He had COVID-19 viral infection last month.  SPO2 in the 80s on arrival improved with 2 L nasal cannula.  Elevated D-dimer today concern for pulmonary embolism and patient currently awaiting CT angiogram, he needed to be premedicated for contrast allergy.  Plan of care is to follow-up on CT scan and disposition.  CBC platelets 401 otherwise within normal limits, no evidence of infection BMP creatinine 1.44, glucose 104, appears mildly dehydrated PT/INR within normal limits High-sensitivity troponin within normal limits x2 D-dimer 7.19 CXR:  IMPRESSION:  No active disease.   [id number] tachycardia Confirmed by Calvin Clay, Calvin Clay ([id number]) on 01/17/2020 3:49:20 AM - Labs and imaging personally interpreted agree with work-up by previous team, primary concern of pulmonary embolism, low suspicion for ACS, dissection or other acute cardiopulmonary etiologies at this time. Physical Exam  BP 133/85   Pulse 96   Temp 98.5 F (36.9 C) (Oral)   Resp 11   SpO2 94%   Physical Exam Constitutional:      General: He is not in acute distress.    Appearance: Normal appearance. He is well-developed. He is not ill-appearing or diaphoretic.  HENT:     Head: Normocephalic and atraumatic.     Right Ear: External ear normal.     Left Ear: External ear normal.     Nose: Nose normal.  Eyes:     General: Vision grossly intact. Gaze aligned appropriately.     Pupils: Pupils are equal, round, and reactive to light.  Neck:     Trachea: Trachea and phonation normal. No tracheal deviation.  Pulmonary:     Effort: Pulmonary effort is normal. No respiratory distress.  Abdominal:     General: There is no distension.     Palpations: Abdomen is soft.     Tenderness: There is no abdominal tenderness. There is no  guarding or rebound.  Musculoskeletal:        General: Normal range of motion.     Cervical back: Normal range of motion.  Skin:    General: Skin is warm and dry.  Neurological:     Mental Status: He is alert.     GCS: GCS eye subscore is 4. GCS verbal subscore is 5. GCS motor subscore is 6.     Comments: Speech is clear and goal oriented, follows commands Major Cranial nerves without deficit, no facial droop Moves extremities without ataxia, coordination intact  Psychiatric:        Behavior: Behavior normal.     ED Course/Procedures   Clinical Course as of Jan 16 1009  Thu Jan 17, 2020  0906 Calvin Clay, internal medicine teaching service   [BM]    Clinical Course User Index [BM] Calvin Boston, PA-C    .Critical Care Performed by: Calvin Boston, PA-C Authorized by: Calvin Boston, PA-C   Critical care provider statement:    Critical care time (minutes):  32   Critical care was necessary to treat or prevent imminent or life-threatening deterioration of the following conditions: Hypoxia due to Pulmonary Embolism requiring supplemental oxygen and IV heparin.   Critical care was time spent personally by me on the following activities:  Discussions with consultants, evaluation of patient's response to treatment, examination of patient, ordering and performing treatments and interventions, ordering and review of laboratory studies, ordering  and review of radiographic studies, pulse oximetry, re-evaluation of patient's condition, obtaining history from patient or surrogate, review of old charts and development of treatment plan with patient or surrogate   MDM  On my initial evaluation patient sitting comfortably in stretcher, submental oxygen via nasal cannula 2 L/min. He has no signs of allergic reaction at this time.  He continues to require supplemental oxygen to maintain adequate saturation.  No acute distress. - 8:25 AM: Personally reviewed CT angiogram with Dr.  Gilford Clay, concern for pulmonary embolism.  Phone call to radiologist reading room, Dr. Dimas Clay advises pulmonary lesions seen, ratio 1.2.  Additionally opacities concerning for infection in the left lung.  Pharmacy consulted for IV heparin.  Patient reassessed, updated on care plan and is agreeable for admission and blood thinners.  - CTA PE study:  IMPRESSION:  1. Acute bilateral right near occlusive lobar, segmental and  subsegmental pulmonary emboli and left segmental and subsegmental  pulmonary emboli. Positive for acute PE with CT evidence of right  heart strain (RV/LV Ratio = 1.2.) consistent with at least  submassive (intermediate risk) PE. The presence of right heart  strain has been associated with an increased risk of morbidity and  mortality.  2. Groundglass opacities and small nodules in the left upper lobe  and lingula may reflect infection or inflammation. Attention on  follow-up.  3. Scattered bilateral linear opacities and left basilar  consolidation likely reflecting atelectasis.    These results were called by telephone at the time of interpretation  on 01/17/2020 at 8:33 am to provider Calvin Clay , who verbally  acknowledged these results.   Consult placed to hospitalist for admission. Patient seen and evaluated by Dr. Gilford Clay during this visit.  As above 2 normal high-sensitivity troponins are reassuring at this time, patient will need to be continually monitored. - 9:06 AM: Discussed case with Dr. Ina Clay from internal medicine teaching service who has accepted patient for admission.  Calvin Clay was evaluated in Emergency Department on 01/17/2020 for the symptoms described in the history of present illness. He was evaluated in the context of the global COVID-19 pandemic, which necessitated consideration that the patient might be at risk for infection with the SARS-CoV-2 virus that causes COVID-19. Institutional protocols and algorithms that pertain to  the evaluation of patients at risk for COVID-19 are in a state of rapid change based on information released by regulatory bodies including the CDC and federal and state organizations. These policies and algorithms were followed during the patient's care in the ED. ----- Addendum 11:08 AM: Impression discussion with interventional radiologist Dr. Earleen Newport has asked for a consult to critical care in case they decide to lyse embolism.  Consult placed.  11:13 AM: Patient reevaluated resting comfortably in bed texting on his phone no acute distress.  SPO2 100%.  11:16 AM: Discussed case with critical care medicine who will be checking on patient shortly.  11:59 AM: Informed Dr. Tarri Abernethy of discussion with interventional radiology and consult to CCM.  At this time patient is on [facility].  Note: Portions of this report may have been transcribed using voice recognition software. Every effort was made to ensure accuracy; however, inadvertent computerized transcription errors may still be present.     Gari Crown 01/17/20 1015    Isla Pence, MD 01/17/20 [place], PA-C 01/17/20 1204    Isla Pence, MD 01/17/20 1213

## 2020-01-17 NOTE — Progress Notes (Signed)
Bilateral lower extremity venous duplex has been completed. Preliminary results can be found in CV Proc through chart review.   01/17/20 2:09 PM Carlos Levering RVT

## 2020-01-17 NOTE — H&P (Signed)
Date: 01/17/2020               Patient Name:  Calvin Clay MRN: [id number]  DOB: June 28, 1971 Age / Sex: 49 y.o., male   PCP: Everardo Beals, NP         Medical Service: Internal Medicine Teaching Service         Attending Physician: Dr. Evette Doffing, Mallie Mussel, *    First Contact: Dr. Marianna Payment Pager: [phone]  Second Contact: Dr. Maricela Bo Pager: [phone]       After Hours (After 5p/  First Contact Pager: [phone]  weekends / holidays): Second Contact Pager: [phone]   Chief Complaint: Chest Pain  History of Present Illness: Calvin Clay is a 49 y.o male with obesity and asthma who presented to the ED with pleuritic chest pain. History was obtained via the patient and through chart review.   Patient states on January 22 he developed a cough and did not feel like himself. On the 23rd he woke up with severe fatigue, shortness of breath, and cough. His wife subsequently urged him to go get tested for COVID. On January 26 his COVID test returned positive. He self quarantined and over the preceding 10 to 14 days his shortness of breath improved; however, he was still dealing with decreased appetite, fatigue, and a slight cough. On 2/8 he presented to the emergency department with acute onset flank pain. 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 He was discharged with pain control. Overall he felt he was moving in the right direction until last night when he developed severe pleuritic chest pain. Describes it as sharp in nature and worse with inspiration and coughing. He tried to wait it out at home but it progressed and he therefore came to the ED for further evaluation/management.  He denies a family history of blood clots or miscarriages. He does have a family history of colon cancer diagnosed in his mom when she was in her 50s to 19s. He has had a colonoscopy. He reports that it was normal. He is a never smoker. He occasionally uses alcohol. He is not been on any long trips or had  significant immobility. He has not noticed any swelling in his lower extremities.  Meds: Current Meds  Medication Sig  . acetaminophen (TYLENOL) 650 MG CR tablet Take 975 mg by mouth every 8 (eight) hours as needed for pain or fever.  Marland Kitchen albuterol (PROVENTIL HFA;VENTOLIN HFA) 108 (90 Base) MCG/ACT inhaler Inhale 2 puffs into the lungs every 6 (six) hours as needed for wheezing or shortness of breath.  . guaifenesin (ROBITUSSIN) 100 MG/5ML syrup Take 200 mg by mouth 3 (three) times daily as needed for cough.  Marland Kitchen ibuprofen (ADVIL) 200 MG tablet Take 600-800 mg by mouth every 6 (six) hours as needed for fever or headache.   Allergies: Allergies as of 01/17/2020 - Review Complete 01/17/2020  Allergen Reaction Noted  . Iodine Anaphylaxis 09/16/2012  . Shrimp [shellfish allergy] Swelling 05/25/2014   Past Medical History:  Diagnosis Date  . Asthma   . GERD (gastroesophageal reflux disease)   . Migraine    Family History: Colon cancer in his mother. No family history of VTE/PE/miscarriages.  Social History: Patient is originally from [place]. He moved to [place] approximately 78 years ago and worked for a year before moving to [place]. He works at [employer]. 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 He is married and has two young children. He has  a 12 year old son and 7 year old daughter. He does not smoke cigarettes or use any other tobacco products. He is an occasional drinker. He has never used illicit substances.  Review of Systems: A complete ROS was negative except as per HPI.   Physical Exam: Blood pressure 133/76, pulse 93, temperature 98.5 F (36.9 C), temperature source Oral, resp. rate (!) 24, SpO2 100 %.  General: Obese male in no acute distress HENT: Normocephalic, atraumatic, moist mucus membranes Pulm: Good air movement with no wheezing or crackles  CV: RRR, no murmurs, no rubs  Abdomen: Active bowel sounds, soft,  non-distended, no tenderness to palpation  Extremities: Pulses palpable in all extremities, no LE edema  Skin: Warm and dry  Neuro: Alert and oriented x 3  EKG: personally reviewed my interpretation is sinus tachycardia with normal axis. Some evidence of right heart strain with S1Q3T3. No acute ST segment changes.  CTA Chest PE 1. Acute bilateral right near occlusive lobar, segmental and subsegmental pulmonary emboli and left segmental and subsegmental pulmonary emboli. Positive for acute PE with CT evidence of right heart strain (RV/LV Ratio = 1.2.) consistent with at least submassive (intermediate risk) PE. The presence of right heart strain has been associated with an increased risk of morbidity and mortality. 2. Groundglass opacities and small nodules in the left upper lobe and lingula may reflect infection or inflammation. Attention on follow-up. 3. Scattered bilateral linear opacities and left basilar consolidation likely reflecting atelectasis.  Assessment & Plan by Problem: Principal Problem:   Acute respiratory failure with hypoxia (HCC) Active Problems:   Severe persistent asthma without complication   Acute pulmonary embolism (HCC)   AKI (acute kidney injury) (HCC)  Calvin Clay is a 49 y.o male with obesity, asthma, and recent confirmed COVID-19 who presented to the ED with acute hypoxic respiratory failure and was subsequently diagnosed with a submassive pulmonary embolism. He was started on IV heparin and admitted for further evaluation/management.  Submassive pulmonary embolism with right heart strain - Currently maintaining saturations on 4 L per minute nasal cannula. He is tachycardic but otherwise normotensive - CTA with RV to LV ratio 1.2 and EKG with evidence of right heart strain. Is reassuring that his troponin is negative x2.  - PESI score of 88 (intermediate risk with 3.2-71% 30 day mortality)  - Have discussed the case with IR and they will evaluate the  patient for possible intervention.  - Continue IV heparin and maintain NPO status while we await IR eval  - Long-term anticoagulation selection is dependent on whether this is provoked versus unprovoked. Given his age it would be reasonable to consider hypercoagulable work-up but I think based on the HPI this would be low yield. No other provoking features identified. It is likely that this was provoked secondary to his COVID-19 infection. And therefore on discharge I would transition him to a DOAC for 3-6 months   Previous COVID-19 Infection  - Originally diagnosed on 1/25 but symptoms started on 1/22  - He has completed quarantine and by [facility] recommendation no longer requires quarantine   AKI - Creatinine elevated to 1.4 from baseline of 1.0-1.1  - Check UA  - Hold nephrotoxic medications  - Given 1L bolus and recheck   Asthma  - Continue Albuterol and Symbicort as prescribed.   Diet: NPO until IR eval  VTE ppx: IV heparin for submassive PE CODE STATUS: Full Code  Dispo: Admit patient to Observation with expected length of stay less than 2 midnights. Unless patient  requires further intervention at which point he will be move to inpatient status.  SignedIna Homes, MD 01/17/2020, 10:00 AM  Pager: [phone]

## 2020-01-17 NOTE — Progress Notes (Signed)
ANTICOAGULATION CONSULT NOTE - Onset for heparin Indication: pulmonary embolus  Allergies  Allergen Reactions  . Iodine Anaphylaxis  . Shrimp [Shellfish Allergy] Swelling    Throat closing    Patient Measurements: Height: 6\' 3"  (190.5 cm) Weight: 299 lb 13.2 oz (136 kg) IBW/kg (Calculated) : 84.5 Heparin Dosing Weight: 114 kg  Vital Signs: Temp: 98.4 F (36.9 C) (02/11 1247) Temp Source: Oral (02/11 1247) BP: 142/89 (02/11 1400) Pulse Rate: 93 (02/11 1400)  Labs: Recent Labs    01/17/20 0115 01/17/20 0315 01/17/20 1341 01/17/20 1638  HGB 14.2  --   --   --   HCT 43.5  --   --   --   PLT 401*  --   --   --   LABPROT 14.0  --   --   --   INR 1.1  --   --   --   HEPARINUNFRC  --   --   --  0.74*  CREATININE 1.44*  --   --   --   TROPONINIHS 5 3 5   --     Estimated Creatinine Clearance: 94.3 mL/min (A) (by C-G formula based on SCr of 1.44 mg/dL (H)).   Medical History: Past Medical History:  Diagnosis Date  . Asthma   . GERD (gastroesophageal reflux disease)   . Migraine     Assessment: 49 yr old male presented to the ED with SOB and CP.  He was recently diagnosed with COVID-19 infection. Pt was found to have bilateral PE with right heart strain. Baseline Hgb is WNL and platelets are elevated at 401. He is not on anticoagulation PTA.   Vascular U/S performed this afternoon did not reveal any evidence of DVT.  Heparin level ~7.5 hrs after heparin 6000 units IV bolus, followed by heparin infusion at 1800 units/hr, was 0.74 units/ml, which is slightly above the goal range for this patient. Per RN, no issues with IV or bleeding observed.  Goal of Therapy:  Heparin level 0.3-0.7 units/ml Monitor platelets by anticoagulation protocol: Yes   Plan:  Decrease heparin infusion to 1700 units/hr Check 6-hr heparin level Monitor daily heparin level and CBC Monitor for signs/symptoms of bleeding  Gillermina Hu, PharmD, BCPS,  [facility] Clinical Pharmacist 01/17/2020,5:34 PM

## 2020-01-17 NOTE — Progress Notes (Signed)
  Echocardiogram 2D Echocardiogram has been performed.  Shauntell Iglesia A Harsimran Westman 01/17/2020, 1:14 PM

## 2020-01-17 NOTE — ED Triage Notes (Signed)
Patient reports intermittent left chest pain with SOB and occasional productive cough ,  Pain increases with deep inspirations onset last month , denies fever or chills , no emesis or diaphoresis .

## 2020-01-17 NOTE — Progress Notes (Signed)
ANTICOAGULATION CONSULT NOTE - Initial Consult  Pharmacy Consult for heparin Indication: pulmonary embolus  Allergies  Allergen Reactions  . Iodine Anaphylaxis  . Shrimp [Shellfish Allergy] Swelling    Throat closing    Patient Measurements:   Heparin Dosing Weight: 114kg  Vital Signs: Temp: 98.5 F (36.9 C) (02/11 0116) Temp Source: Oral (02/11 0116) BP: 127/81 (02/11 0815) Pulse Rate: 100 (02/11 0815)  Labs: Recent Labs    01/14/20 1534 01/17/20 0115 01/17/20 0315  HGB 14.1 14.2  --   HCT 42.0 43.5  --   PLT 367 401*  --   LABPROT  --  14.0  --   INR  --  1.1  --   CREATININE 1.19 1.44*  --   TROPONINIHS  --  5 3    Estimated Creatinine Clearance: 94.3 mL/min (A) (by C-G formula based on SCr of 1.44 mg/dL (H)).   Medical History: Past Medical History:  Diagnosis Date  . Asthma   . GERD (gastroesophageal reflux disease)   . Migraine     Medications:  Infusions:  . heparin      Assessment: 76 yom presented to the ED with SOB and CP. Recently diagnosed with COVID-19 infection. Found to have a bilateral PE with right heart strain. Baseline Hgb is WNL and platelets are elevated. He is not on anticoagulation PTA.   Goal of Therapy:  Heparin level 0.3-0.7 units/ml Monitor platelets by anticoagulation protocol: Yes   Plan:  Heparin bolus 6000 units IV x 1 Heparin gtt 1800 units/hr Check a 6 hr heparin level Daily heparin level and CBC  Milfred Krammes, Rande Lawman 01/17/2020,8:41 AM

## 2020-01-17 NOTE — ED Provider Notes (Signed)
[place] EMERGENCY DEPARTMENT Provider Note   CSN: [id number] Arrival date & time: 01/17/20  0106     History Chief Complaint  Patient presents with  . Chest Pain    Calvin Clay is a 49 y.o. male who presents to the ED with a cc of SOB. Patient c/o Peluritic L sided CP. Patient states that he has had intermittent episodes cp and sob since he got Covid on January 20.  Patient states that the pain is pleuritic in nature, sharp.  He has had episodes of feeling like his heart is racing or skipping beats.  He has had difficulty with his breathing ever since Covid and feels like his Symbicort and albuterol have not been very helpful.  He denies any wheezing.  Today he had onset of the most severe left-sided sharp pleuritic chest pain with racing heart he has had since the coronavirus.  He got very short of breath.  He states it has eased up but is still present and feels winded.  He denies any unilateral leg swelling or pain.  He denies any cough, wheezing.  He denies any heavy lifting.  HPI     Past Medical History:  Diagnosis Date  . Asthma   . GERD (gastroesophageal reflux disease)   . Migraine     Patient Active Problem List   Diagnosis Date Noted  . Chest pain, rule out acute myocardial infarction 08/23/2017  . Tremor 07/27/2017  . Severe persistent asthma without complication [id number]  . Asthma exacerbation 06/30/2017  . Migraine 09/16/2012    Past Surgical History:  Procedure Laterality Date  . COSMETIC SURGERY    . ESOPHAGOGASTRODUODENOSCOPY  09/16/2012   Procedure: ESOPHAGOGASTRODUODENOSCOPY (EGD);  Surgeon: Ladene Artist, MD,FACG;  [facility] ENDOSCOPY;  Service: Endoscopy;  Laterality: N/A;       Family History  Problem Relation Age of Onset  . Colon cancer Mother   . Colon cancer Father     Social History   Tobacco Use  . Smoking status: Never Smoker  . Smokeless tobacco: Never Used  Substance Use Topics  . Alcohol use: Yes      Comment: occasional  . Drug use: No    Home Medications Prior to Admission medications   Medication Sig Start Date End Date Taking? Authorizing Provider  acetaminophen (TYLENOL) 650 MG CR tablet Take 975 mg by mouth every 8 (eight) hours as needed for pain or fever.   Yes [provider]  albuterol (PROVENTIL HFA;VENTOLIN HFA) 108 (90 Base) MCG/ACT inhaler Inhale 2 puffs into the lungs every 6 (six) hours as needed for wheezing or shortness of breath. 07/27/17  Yes Elsie Stain, MD  guaifenesin (ROBITUSSIN) 100 MG/5ML syrup Take 200 mg by mouth 3 (three) times daily as needed for cough.   Yes [provider]  ibuprofen (ADVIL) 200 MG tablet Take 600-800 mg by mouth every 6 (six) hours as needed for fever or headache.   Yes [provider]  budesonide-formoterol (SYMBICORT) 160-4.5 MCG/ACT inhaler Inhale 2 puffs into the lungs 2 (two) times daily. 07/27/17   Elsie Stain, MD    Allergies    Iodine and Shrimp [shellfish allergy]  Review of Systems   Review of Systems Ten systems reviewed and are negative for acute change, except as noted in the HPI.   Physical Exam Updated Vital Signs BP 133/85   Pulse 96   Temp 98.5 F (36.9 C) (Oral)   Resp 11   SpO2 94%  Physical Exam Vitals and nursing note reviewed.  Constitutional:      General: He is not in acute distress.    Appearance: He is well-developed. He is not diaphoretic.  HENT:     Head: Normocephalic and atraumatic.  Eyes:     General: No scleral icterus.    Conjunctiva/sclera: Conjunctivae normal.  Cardiovascular:     Rate and Rhythm: Normal rate and regular rhythm.     Heart sounds: Normal heart sounds.  Pulmonary:     Effort: Pulmonary effort is normal. Tachypnea present. No respiratory distress.     Breath sounds: Normal breath sounds. No decreased breath sounds, wheezing or rhonchi.  Abdominal:     Palpations: Abdomen is soft.     Tenderness: There is no abdominal  tenderness.  Musculoskeletal:     Cervical back: Normal range of motion and neck supple.     Right lower leg: No edema.     Left lower leg: No edema.  Skin:    General: Skin is warm and dry.  Neurological:     Mental Status: He is alert.  Psychiatric:        Behavior: Behavior normal.     ED Results / Procedures / Treatments   Labs (all labs ordered are listed, but only abnormal results are displayed) Labs Reviewed  BASIC METABOLIC PANEL - Abnormal; Notable for the following components:      Result Value   Glucose, Bld 104 (*)    Creatinine, Ser 1.44 (*)    GFR calc non Af Amer 57 (*)    All other components within normal limits  CBC - Abnormal; Notable for the following components:   Platelets 401 (*)    All other components within normal limits  D-DIMER, QUANTITATIVE (NOT AT [facility]) - Abnormal; Notable for the following components:   D-Dimer, Quant 7.19 (*)    All other components within normal limits  PROTIME-INR  TROPONIN I (HIGH SENSITIVITY)  TROPONIN I (HIGH SENSITIVITY)    EKG EKG Interpretation  Date/Time:  Thursday January 17 2020 01:09:18 EST Ventricular Rate:  103 PR Interval:  182 QRS Duration: 76 QT Interval:  336 QTC Calculation: 440 R Axis:   33 Text Interpretation: Sinus tachycardia Confirmed by Dory Horn) on 01/17/2020 3:49:20 AM 103  Radiology DG Chest Port 1 View  Result Date: 01/17/2020 CLINICAL DATA:  Shortness of breath EXAM: PORTABLE CHEST 1 VIEW COMPARISON:  12/17/2018 FINDINGS: The heart size and mediastinal contours are within normal limits. Both lungs are clear. The visualized skeletal structures are unremarkable. IMPRESSION: No active disease. Electronically Signed   By: Ulyses Jarred M.D.   On: 01/17/2020 02:43    Procedures Procedures (including critical care time)  Medications Ordered in ED Medications  sodium chloride flush (NS) 0.9 % injection 3 mL (3 mLs Intravenous Not Given 01/17/20 0139)  diphenhydrAMINE  (BENADRYL) capsule 50 mg (50 mg Oral Given 01/17/20 0622)    Or  diphenhydrAMINE (BENADRYL) injection 50 mg ( Intravenous See Alternative 01/17/20 0622)  hydrocortisone sodium succinate (SOLU-CORTEF) injection 200 mg (200 mg Intravenous Given 01/17/20 0348)  LORazepam (ATIVAN) injection 1 mg (1 mg Intravenous Given 01/17/20 0402)    ED Course  I have reviewed the triage vital signs and the nursing notes.  Pertinent labs & imaging results that were available during my care of the patient were reviewed by me and considered in my medical decision making (see chart for details).    MDM Rules/Calculators/A&P  [id number] pain and sob VS:  Vitals:   01/17/20 0615 01/17/20 0645 01/17/20 0700 01/17/20 0715  BP: 117/79 125/82 126/85 133/85  Pulse: 86 (!) 103 (!) 101 96  Resp: 16 18 12 11   Temp:      TempSrc:      SpO2: 96% 95% 98% 94%   [id number] is gathered by patient  and EMR. DDX:The emergent differential diagnosis of chest pain includes: Acute coronary syndrome, pericarditis, aortic dissection, pulmonary embolism, tension pneumothorax, pneumonia, and esophageal rupture.  Labs: I reviewed the labs which show negative troponin level, BMP shows slightly elevated creatinine with no AKI.  CBC shows no abnormality.  Normal PT/INR, D-dimer is markedly elevated at 7.19 Imaging: I personally reviewed the images (1 view chest x-ray) which show(s) no acute abnormalities on my interpretation. EKG: Sinus tachycardia at a rate of 103 by my interpretation MDM: Patient here with pleuritic chest pain, tachypnea, tachycardia.  During my physical examination patient had multiple episodes of oxygen desaturation with good waveform down into the 80s.  Patient placed on 2 L via nasal cannula.  Time so my differential is obviously pulmonary embolus given patient's markedly elevated D-dimer and vital signs.  I have ordered a CT angiogram however he required contrast allergy premedication.  CT  angiogram is pending.  Have given signout to PA [facility]   Final Clinical Impression(s) / ED Diagnoses Final diagnoses:  None    Rx / DC Orders ED Discharge Orders    None       Margarita Mail, PA-C 01/17/20 [id number]    Palumbo, April, MD 01/22/20 2307

## 2020-01-17 NOTE — Consult Note (Signed)
Chief Complaint: Patient was seen in consultation today for image guided catheter directed PE lysis  Referring Physician(s): Ina Homes, MD  Supervising Physician: Corrie Mckusick  Patient Status: [facility] - ED  History of Present Illness: Calvin Clay is a 49 y.o. male with a past medical history significant for asthma, GERD, migraines and previous Covid-19 infection (dx 12/26/19) who presented to [facility] ED early this morning with complaints of weakness, dyspnea and left sided chest pain. Initial workup notable for creatinine 1.44, plt 401, troponin 3>>5, INR 1.1, D-Dimer 7.19. CTA chest notable for acute bilateral right near occlusive lobar, segmental and subsegmental pulmonary emboli with CT evidence of right heart strain, at least submassive PE (intermediate risk). He was started on heparin gtt and IR has been consulted for possible catheter directed PE lysis.   Calvin Clay was seen in the ED by myself and Dr. Earleen Newport - he reports that his dyspnea and chest pain has improved from earlier and is now mild at rest, he has not been ambulating so he is unsure if this is changed with exertion. He reports that he has been having trouble breathing since he was diagnosed with Covid-19 on 12/26/19 and was quarantining at home with some improvement in dyspnea until last night when he suddenly developed severe pleuritic chest pain and dyspnea which brought him to the ED. He denies any dizziness, headache, light headedness, syncope, lower extremity swelling or pain. He denies a history of blood clots or bleeding disorders, recent travel, cancer, trauma, recent surgery, history of stroke or GI bleeding. 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 He is typically fully ambulatory at home without difficulty.   Past Medical History:  Diagnosis Date  . Asthma   . GERD (gastroesophageal reflux  disease)   . Migraine     Past Surgical History:  Procedure Laterality Date  . COSMETIC SURGERY    . ESOPHAGOGASTRODUODENOSCOPY  09/16/2012   Procedure: ESOPHAGOGASTRODUODENOSCOPY (EGD);  Surgeon: Ladene Artist, MD,FACG;  Location: [facility] ENDOSCOPY;  Service: Endoscopy;  Laterality: N/A;    Allergies: Iodine and Shrimp [shellfish allergy]  Medications: Prior to Admission medications   Medication Sig Start Date End Date Taking? Authorizing Provider  acetaminophen (TYLENOL) 650 MG CR tablet Take 975 mg by mouth every 8 (eight) hours as needed for pain or fever.   Yes [provider]  albuterol (PROVENTIL HFA;VENTOLIN HFA) 108 (90 Base) MCG/ACT inhaler Inhale 2 puffs into the lungs every 6 (six) hours as needed for wheezing or shortness of breath. 07/27/17  Yes Elsie Stain, MD  guaifenesin (ROBITUSSIN) 100 MG/5ML syrup Take 200 mg by mouth 3 (three) times daily as needed for cough.   Yes [provider]  ibuprofen (ADVIL) 200 MG tablet Take 600-800 mg by mouth every 6 (six) hours as needed for fever or headache.   Yes [provider]  budesonide-formoterol (SYMBICORT) 160-4.5 MCG/ACT inhaler Inhale 2 puffs into the lungs 2 (two) times daily. 07/27/17   Elsie Stain, MD     Family History  Problem Relation Age of Onset  . Colon cancer Mother   . Colon cancer Father     Social History   Socioeconomic History  . Marital status: Married    Spouse name: Not on file  . Number of children: Not on file  . Years of education: Not on file  .  Highest education level: Not on file  Occupational History  . Not on file  Tobacco Use  . Smoking status: Never Smoker  . Smokeless tobacco: Never Used  Substance and Sexual Activity  . Alcohol use: Yes    Comment: occasional  . Drug use: No  . Sexual activity: Not on file  Other Topics Concern  . Not on file  Social History Narrative  . Not on file   Social Determinants of Health   Financial Resource  Strain:   . Difficulty of Paying Living Expenses: Not on file  Food Insecurity:   . Worried About Charity fundraiser in the Last Year: Not on file  . Ran Out of Food in the Last Year: Not on file  Transportation Needs:   . Lack of Transportation (Medical): Not on file  . Lack of Transportation (Non-Medical): Not on file  Physical Activity:   . Days of Exercise per Week: Not on file  . Minutes of Exercise per Session: Not on file  Stress:   . Feeling of Stress : Not on file  Social Connections:   . Frequency of Communication with Friends and Family: Not on file  . Frequency of Social Gatherings with Friends and Family: Not on file  . Attends Religious Services: Not on file  . Active Member of Clubs or Organizations: Not on file  . Attends Archivist Meetings: Not on file  . Marital Status: Not on file     Review of Systems: A 12 point ROS discussed and pertinent positives are indicated in the HPI above.  All other systems are negative.  Review of Systems  Constitutional: Positive for fatigue. Negative for chills and fever.  HENT: Negative for nosebleeds.   Respiratory: Positive for shortness of breath (mild now - improved from earlier today). Negative for cough.   Cardiovascular: Positive for chest pain (mild - left sided with inspiration; very mild currently). Negative for palpitations and leg swelling.  Gastrointestinal: Negative for abdominal pain, blood in stool, diarrhea, nausea and vomiting.  Genitourinary: Negative for hematuria.  Skin: Negative for color change and pallor.  Neurological: Negative for dizziness, syncope, light-headedness and headaches.    Vital Signs: BP 113/80   Pulse (!) 102   Temp 98.5 F (36.9 C) (Oral)   Resp 15   SpO2 100%   Physical Exam Vitals and nursing note reviewed.  Constitutional:      General: He is not in acute distress.    Appearance: He is not ill-appearing.     Comments: Very pleasant, talkative, good historian.  Easily speaks in complete sentences during exam. No visitors or staff at bedside.  HENT:     Head: Normocephalic.  Cardiovascular:     Rate and Rhythm: Regular rhythm. Tachycardia present.     Heart sounds: No murmur.  Pulmonary:     Effort: Pulmonary effort is normal.     Breath sounds: No wheezing, rhonchi or rales.     Comments: (+) decreased breath sounds bilaterally Nasal cannula in place but on room air currently. Chest:     Chest wall: No tenderness.  Abdominal:     General: There is no distension.     Palpations: Abdomen is soft.     Tenderness: There is no abdominal tenderness.  Musculoskeletal:     Right lower leg: No edema.     Left lower leg: No edema.  Skin:    General: Skin is warm and dry.     Coloration:  Skin is not pale.     Findings: No bruising.  Neurological:     Mental Status: He is alert and oriented to person, place, and time.  Psychiatric:        Mood and Affect: Mood normal.        Behavior: Behavior normal.        Thought Content: Thought content normal.        Judgment: Judgment normal.      Imaging: CT Angio Chest PE W and/or Wo Contrast  Result Date: 01/17/2020 CLINICAL DATA:  PE suspected, positive D-dimer. EXAM: CT ANGIOGRAPHY CHEST WITH CONTRAST TECHNIQUE: Multidetector CT imaging of the chest was performed using the standard protocol during bolus administration of intravenous contrast. Multiplanar CT image reconstructions and MIPs were obtained to evaluate the vascular anatomy. CONTRAST:  32mL OMNIPAQUE IOHEXOL 350 MG/ML SOLN COMPARISON:  Chest radiograph 01/17/2020 FINDINGS: Cardiovascular: Satisfactory opacification of the pulmonary arteries to the segmental level. The patient has near occlusive thrombus involving the right inter lobar pulmonary artery as well as occlusive and nonocclusive acute central filling defects in the right lower lobe and right upper lobe. On the left side there is segmental and subsegmental nonocclusive and occlusive  filling defects involving the left lower lobe and lingular pulmonary arteries. There is an elevated right to left ventricle ratio of 1.2 suggesting right heart strain. Heart size is normal. No pericardial effusion. Mediastinum/Nodes: No enlarged mediastinal, hilar, or axillary lymph nodes. Thyroid gland, trachea, and esophagus demonstrate no significant findings. Lungs/Pleura: There are scattered bilateral linear and bandlike opacities likely reflecting atelectasis. In the left upper lobe and lingula there are ground-glass opacities and small nodules raising the possibility of infection or inflammation. No pneumothorax or large pleural effusion. There is mild consolidation at the left lung base favored to reflect atelectasis. Upper Abdomen: No acute abnormality. Musculoskeletal: No chest wall abnormality. No acute osseous abnormality. Flowing osteophytosis in the mid to lower thoracic spine. Review of the MIP images confirms the above findings. IMPRESSION: 1. Acute bilateral right near occlusive lobar, segmental and subsegmental pulmonary emboli and left segmental and subsegmental pulmonary emboli. Positive for acute PE with CT evidence of right heart strain (RV/LV Ratio = 1.2.) consistent with at least submassive (intermediate risk) PE. The presence of right heart strain has been associated with an increased risk of morbidity and mortality. 2. Groundglass opacities and small nodules in the left upper lobe and lingula may reflect infection or inflammation. Attention on follow-up. 3. Scattered bilateral linear opacities and left basilar consolidation likely reflecting atelectasis. These results were called by telephone at the time of interpretation on 01/17/2020 at 8:33 am to provider ABIGAIL HARRIS , who verbally acknowledged these results. Electronically Signed   By: Audie Pinto M.D.   On: 01/17/2020 08:36   DG Chest Port 1 View  Result Date: 01/17/2020 CLINICAL DATA:  Shortness of breath EXAM: PORTABLE  CHEST 1 VIEW COMPARISON:  12/17/2018 FINDINGS: The heart size and mediastinal contours are within normal limits. Both lungs are clear. The visualized skeletal structures are unremarkable. IMPRESSION: No active disease. Electronically Signed   By: Ulyses Jarred M.D.   On: 01/17/2020 02:43   CT Renal Stone Study  Addendum Date: 01/14/2020   ADDENDUM REPORT: 01/14/2020 19:49 ADDENDUM: Please note there is findings of multifocal pneumonia, possibly viral or atypical in etiology. Clinical correlation is recommended. Electronically Signed   By: Anner Crete M.D.   On: 01/14/2020 19:49   Result Date: 01/14/2020 CLINICAL DATA:  49 year old male  with flank pain. Concern for kidney stone. EXAM: CT ABDOMEN AND PELVIS WITHOUT CONTRAST TECHNIQUE: Multidetector CT imaging of the abdomen and pelvis was performed following the standard protocol without IV contrast. COMPARISON:  CT abdomen pelvis dated 09/27/2015. FINDINGS: Evaluation of this exam is limited in the absence of intravenous contrast. Lower chest: Bilateral faint clusters of ground-glass opacity in the visualized lung bases most consistent with multifocal pneumonia, likely atypical or viral in etiology. Clinical correlation is recommended. No intra-abdominal free air or free fluid. Hepatobiliary: Diffuse fatty infiltration of the liver. No intrahepatic biliary ductal dilatation. The gallbladder is unremarkable. Pancreas: Unremarkable. No pancreatic ductal dilatation or surrounding inflammatory changes. Spleen: Normal in size without focal abnormality. Adrenals/Urinary Tract: The adrenal glands are unremarkable. There is a 5 mm stone along the posterior wall of the urinary bladder, likely a recently passed right renal calculus and less likely a right UVJ stone. There is mild right hydronephrosis. No stone identified in the right kidney or ureter. There is a 6 mm nonobstructing left renal interpolar calculus. There is no hydronephrosis on the left. Ill-defined  left renal interpolar 15 mm hypodense lesion is not characterized on this CT but most likely represents a cyst. Stomach/Bowel: There is sigmoid diverticulosis and scattered colonic diverticula without active inflammatory changes. There is no bowel obstruction or active inflammation. The appendix is normal. Vascular/Lymphatic: The abdominal aorta and IVC are grossly unremarkable. No portal venous gas. There is no adenopathy. Reproductive: The prostate and seminal vesicles are grossly unremarkable. Other: None Musculoskeletal: Multilevel facet arthropathy. No acute osseous pathology. IMPRESSION: 1. Mild right hydronephrosis secondary to a recently passed right renal calculus within the urinary bladder. 2. Nonobstructing left renal calculus. No hydronephrosis on the left. 3. Colonic diverticulosis. No bowel obstruction or active inflammation. Normal appendix. 4. Fatty liver. Electronically Signed: By: Anner Crete M.D. On: 01/14/2020 19:35    Labs:  CBC: Recent Labs    01/14/20 1534 01/17/20 0115  WBC 7.7 9.7  HGB 14.1 14.2  HCT 42.0 43.5  PLT 367 401*    COAGS: Recent Labs    01/17/20 0115  INR 1.1    BMP: Recent Labs    01/14/20 1534 01/17/20 0115  NA 139 138  K 4.2 4.1  CL 103 102  CO2 25 25  GLUCOSE 93 104*  BUN 10 11  CALCIUM 9.3 9.7  CREATININE 1.19 1.44*  GFRNONAA >60 57*  GFRAA >60 >60    LIVER FUNCTION TESTS: Recent Labs    01/14/20 1534 01/17/20 1011  BILITOT 0.4 1.8*  AST 35 38  ALT 44 34  ALKPHOS 72 69  PROT 7.2 7.8  ALBUMIN 3.5 3.6    TUMOR MARKERS: No results for input(s): AFPTM, CEA, CA199, CHROMGRNA in the last 8760 hours.  Assessment and Plan:  49 y/o M with past medical history as per HPI who presented to [facility] ED early this morning with complaints of dyspnea, chest pain and weakness - initial workup notable for bilateral PE with CT evidence of right heart strain. He has been started on heparin gtt and IR has been consulted for possible  catheter directed PE lysis.   Patient seen in the ED in conjunction with Dr. Earleen Newport - we discussed the natural trajectory of PE and typical conservative management including heparin gtt/hospital admission with outpatient NOAC for several months. We also discussed in detail the indications, risks, benefits and alternatives to catheter directed PE lysis including need for ICU admission during tPA infusion. All questions were answered  to patient's satisfaction and he is agreeable to proceed if it is determined that would be the best course of action for him.  Patient is essentially hemodynamically stable on room air, he is slightly tachycardic but otherwise vital signs are within normal limits. His chest pain and dyspnea has improved after starting heparin gtt. His troponins thus far have been negative. He has no overt signs of bleeding, H/H has remained stable, INR 1.1. Creatinine elevated at 1.44 for which he is currently receiving IVF.  Prior to proceeding with catheter directed PE lysis his case will need to be discussed further with admitting service/CCM by Dr. Earleen Newport. Additionally he will need an echocardiogram to evaluate right heart strain seen on CTA.  Please see Dr. Pasty Arch attestation at the top of this note for full plan.  No consent has been signed as of this note writing - will see patient for consent should the decision be made to proceed with catheter directed PE lysis. He understands and is agreeable to this plan.  IR will continue to follow, please call with questions or concerns.   Thank you for this interesting consult.  I greatly enjoyed meeting Calvin Clay and look forward to participating in their care.  A copy of this report was sent to the requesting provider on this date.  Electronically Signed: Joaquim Nam, PA-C 01/17/2020, 11:22 AM   I spent a total of 40 Minutes  in face to face in clinical consultation, greater than 50% of which was counseling/coordinating  care for catheter directed PE lysis.

## 2020-01-17 NOTE — Consult Note (Signed)
NAME:  Calvin Clay, MRN:  [id number], DOB:  1971-02-04, LOS: 0 ADMISSION DATE:  01/17/2020, CONSULTATION DATE:  01/17/20 REFERRING MD:  Dr. Evette Doffing, CHIEF COMPLAINT:  SOB   Brief History   49 y/o M, recent COVID + (1/26), admitted 2/11 with SOB, chest pain in the setting of submassive PE.   History of present illness   49 y/o M with PMH of asthma, GERD and migraines who presented to [facility] on 2/11 with reports of shortness of breath and chest pain.   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  He reports he began feeling poorly around 1/22 and knew that several people in his office were sick.  On 1/26 at his wife's urging he went for a COVID test and was positive. He had brief loss of taste/smell, predominately dry cough, body aches and fevers.  He reports he drank a lot of tea with lemon in the hopes of feeling better but suspects this contributed to dehydration that led to a kidney stone.  He was seen in the ER on 2/8 for abrupt onset flank pain.  CT renal study showed mild right hydronephrosis in the setting of a recently passed right renal calculi in the urinary bladder, non-obstructing left renal calculi, no hydronephrosis.   He developed intermittent chest pain and worsening shortness of breath during his bout with COVID which subsequently became worse approximately 48 hours prior to presentation.  The chest pain was more intense but he tried to wait.  Again, at his wife's urging he went to the ER for evaluation.  He required pre-medication for CTA PE study which revealed acute bilateral PE.  Right side near occlusive lobar, segmental and subsegmental, left segmental and subsegmental PE with RV/LV ratio of 1.2 consistent with at least submassive PE.  Ground glass opacities & small nodules in the LUL and lingula, scattered bilateral linear opacities and left basilar consolidation likely reflecting atelectasis. The patient was started on IV heparin.  IR  consulted for evaluation of catheter directed thrombolysis. PCCM consulted for evaluation of pulmonary embolism.  Past Medical History  COVID - 1/26  Migraines GERD Asthma   Significant Hospital Events   2/11 Admit with SOB, PE positive, COVID  Consults:  IR  PCCM   Procedures:    Significant Diagnostic Tests:  EKG 2/11 >> sinus tachycardia  CTA Chest 2/11 >> acute bilateral PE.  Right side near occlusive lobar, segmental and subsegmental, left segmental and subsegmental PE with RV/LV ratio of 1.2 consistent with at least submassive PE.  Ground glass opacities & small nodules in the LUL and lingula, scattered bilateral linear opacities and left basilar consolidation likely reflecting atelectasis.  ECHO 2/11 >>  LE Venous Duplex 2/11 >>   Micro Data:  COVID 2/11 >>  Influenza A/B 2/11 >>   Antimicrobials:      Interim history/subjective:  Pt reports feeling better since heparin infusion initiated, less SOB, less anxiety.  Remains on 4L O2, sats 99-100%  Objective   Blood pressure 126/85, pulse 97, temperature 98.5 F (36.9 C), temperature source Oral, resp. rate 15, SpO2 100 %.        Intake/Output Summary (Last 24 hours) at 01/17/2020 1204 Last data filed at 01/17/2020 1104 Gross per 24 hour  Intake 500 ml  Output --  Net 500 ml   There were no vitals filed for this visit.  Examination: General: pleasant adult male in NAD, sitting on side  of bed HEENT: MM pink/moist, no jvd, anicteric, lids/lashes normal  Neuro: AAOx4, speech clear, MAE, normal strength  CV: s1s2 rrr, ST, no m/r/g PULM:  Non-labored on [place] O2, sats 99-100% on 4L, lungs bilaterally diminished, faint wheeze left lower posterior GI: soft, bsx4 active  Extremities: warm/dry, no edema  Skin: no rashes or lesions  Resolved Hospital Problem list     Assessment & Plan:   Submassive Pulmonary Embolism  Suspect provoked PE with recent COVID positive / immobility from illness. CTA chest with acute  bilateral PE, RV/LV ratio of 1.2.  HS-Troponin 3.  EKG with ST. No hx of blood clots or in family.   -continue heparin gtt per pharmacy  -consider 48 hours IV heparin before transition to PO anticoagulation -trend troponin -assess LE venous duplex to r/o DVT -assess ECHO to evaluate for RV strain, r/o thrombus  -reviewed risks / indications of tPA administration with patient.  He does not meet criteria at this time for tPA administration as he is hemodynamically stable -hold catheter directed thrombolytics for now given hemodynamic stability and minimal O2, await ECHO findings -will need 3-6 months anticoagulation   COVID  Positive on 1/26  -supportive care  Asthma  -continue symbicort, PRN albuterol   Renal Calculi  Seen 2/8 for acute flank pain, mild hydro on right / passed stone.   -supportive care  -ensure adequate hydration, NS at 172ml/hr for 20 hours (post dye load for CTA)  Anxiety  -PRN low dose ativan   Best practice:  Diet: Clear liquids Pain/Anxiety/Delirium protocol (if indicated): n/a VAP protocol (if indicated): n/a DVT prophylaxis: heparin infusion  GI prophylaxis: n/a Glucose control: n/a Mobility: BR until heparin level therapeutic  Code Status: Full Code  Family Communication: Patient updated on plan of care.  Disposition: Telemetry  Labs   CBC: Recent Labs  Lab 01/14/20 1534 01/17/20 0115  WBC 7.7 9.7  HGB 14.1 14.2  HCT 42.0 43.5  MCV 87.9 83.0  PLT 367 401*    Basic Metabolic Panel: Recent Labs  Lab 01/14/20 1534 01/17/20 0115  NA 139 138  K 4.2 4.1  CL 103 102  CO2 25 25  GLUCOSE 93 104*  BUN 10 11  CREATININE 1.19 1.44*  CALCIUM 9.3 9.7   GFR: Estimated Creatinine Clearance: 94.3 mL/min (A) (by C-G formula based on SCr of 1.44 mg/dL (H)). Recent Labs  Lab 01/14/20 1534 01/17/20 0115  WBC 7.7 9.7    Liver Function Tests: Recent Labs  Lab 01/14/20 1534 01/17/20 1011  AST 35 38  ALT 44 34  ALKPHOS 72 69  BILITOT 0.4  1.8*  PROT 7.2 7.8  ALBUMIN 3.5 3.6   Recent Labs  Lab 01/14/20 1534  LIPASE 30   No results for input(s): AMMONIA in the last 168 hours.  ABG No results found for: PHART, PCO2ART, PO2ART, HCO3, TCO2, ACIDBASEDEF, O2SAT   Coagulation Profile: Recent Labs  Lab 01/17/20 0115  INR 1.1    Cardiac Enzymes: No results for input(s): CKTOTAL, CKMB, CKMBINDEX, TROPONINI in the last 168 hours.  HbA1C: No results found for: HGBA1C  CBG: No results for input(s): GLUCAP in the last 168 hours.  Review of Systems: Positives in [place]   Gen: Denies fever, chills, weight change, fatigue, night sweats HEENT: Denies blurred vision, double vision, hearing loss, tinnitus, sinus congestion, rhinorrhea, sore throat, neck stiffness, dysphagia PULM: Denies shortness of breath, cough, sputum production, hemoptysis, wheezing CV: Denies chest pain, edema, orthopnea, paroxysmal nocturnal dyspnea, palpitations GI:  Denies abdominal pain, nausea, vomiting, diarrhea, hematochezia, melena, constipation, change in bowel habits GU: Denies dysuria, hematuria, polyuria, oliguria, urethral discharge Endocrine: Denies hot or cold intolerance, polyuria, polyphagia or appetite change Derm: Denies rash, dry skin, scaling or peeling skin change Heme: Denies easy bruising, bleeding, bleeding gums Neuro: Denies headache, numbness, weakness, slurred speech, loss of memory or consciousness   Past Medical History  He,  has a past medical history of Asthma, GERD (gastroesophageal reflux disease), and Migraine.   Surgical History    Past Surgical History:  Procedure Laterality Date  . COSMETIC SURGERY    . ESOPHAGOGASTRODUODENOSCOPY  09/16/2012   Procedure: ESOPHAGOGASTRODUODENOSCOPY (EGD);  Surgeon: Ladene Artist, MD,FACG;  Location: [facility] END[facility]COPY;  Service: Endoscopy;  Laterality: N/A;     Social History   reports that he has never smoked. He has never used smokeless tobacco. He reports current alcohol use. He  reports that he does not use drugs.   Family History   His family history includes Colon cancer in his father and mother.   Allergies Allergies  Allergen Reactions  . Iodine Anaphylaxis  . Shrimp [Shellfish Allergy] Swelling    Throat closing     Home Medications  Prior to Admission medications   Medication Sig Start Date End Date Taking? Authorizing Provider  acetaminophen (TYLENOL) 650 MG CR tablet Take 975 mg by mouth every 8 (eight) hours as needed for pain or fever.   Yes [provider]  albuterol (PROVENTIL HFA;VENTOLIN HFA) 108 (90 Base) MCG/ACT inhaler Inhale 2 puffs into the lungs every 6 (six) hours as needed for wheezing or shortness of breath. 07/27/17  Yes Elsie Stain, MD  guaifenesin (ROBITUSSIN) 100 MG/5ML syrup Take 200 mg by mouth 3 (three) times daily as needed for cough.   Yes [provider]  ibuprofen (ADVIL) 200 MG tablet Take 600-800 mg by mouth every 6 (six) hours as needed for fever or headache.   Yes [provider]  budesonide-formoterol (SYMBICORT) 160-4.5 MCG/ACT inhaler Inhale 2 puffs into the lungs 2 (two) times daily. 07/27/17   Elsie Stain, MD     Critical care time: n/a    Noe Gens, MSN, NP-C [facility] 01/17/2020, 12:04 PM   Please see [email or web address] for pager details.

## 2020-01-17 NOTE — ED Notes (Signed)
Report attempted, 5W unable to take at this time 

## 2020-01-17 NOTE — ED Notes (Signed)
Pt placed on 2L for comfort  ?

## 2020-01-18 DIAGNOSIS — I2699 Other pulmonary embolism without acute cor pulmonale: Secondary | ICD-10-CM | POA: Diagnosis not present

## 2020-01-18 DIAGNOSIS — N179 Acute kidney failure, unspecified: Secondary | ICD-10-CM | POA: Diagnosis not present

## 2020-01-18 DIAGNOSIS — E669 Obesity, unspecified: Secondary | ICD-10-CM | POA: Diagnosis not present

## 2020-01-18 DIAGNOSIS — J45909 Unspecified asthma, uncomplicated: Secondary | ICD-10-CM | POA: Diagnosis not present

## 2020-01-18 LAB — BASIC METABOLIC PANEL
Anion gap: 6 (ref 5–15)
BUN: 10 mg/dL (ref 6–20)
CO2: 23 mmol/L (ref 22–32)
Calcium: 8.3 mg/dL — ABNORMAL LOW (ref 8.9–10.3)
Chloride: 110 mmol/L (ref 98–111)
Creatinine, Ser: 1.14 mg/dL (ref 0.61–1.24)
GFR calc Af Amer: >60 mL/min (ref >60)
GFR calc non Af Amer: >60 mL/min (ref >60)
Glucose, Bld: 92 mg/dL (ref 70–99)
Potassium: 4 mmol/L (ref 3.5–5.1)
Sodium: 139 mmol/L (ref 135–145)

## 2020-01-18 LAB — CBC
HCT: 36.4 % — ABNORMAL LOW (ref 39.0–52.0)
Hemoglobin: 12.1 g/dL — ABNORMAL LOW (ref 13.0–17.0)
MCH: 29 pg (ref 26.0–34.0)
MCHC: 33.2 g/dL (ref 30.0–36.0)
MCV: 87.3 fL (ref 80.0–100.0)
Platelets: 333 10*3/uL (ref 150–400)
RBC: 4.17 MIL/uL — ABNORMAL LOW (ref 4.22–5.81)
RDW: 13.6 % (ref 11.5–15.5)
WBC: 7.7 10*3/uL (ref 4.0–10.5)
nRBC: 0 % (ref 0.0–0.2)

## 2020-01-18 LAB — HEPARIN LEVEL (UNFRACTIONATED)
Heparin Unfractionated: 0.48 IU/mL (ref 0.30–0.70)
Heparin Unfractionated: 0.59 IU/mL (ref 0.30–0.70)

## 2020-01-18 LAB — HEMOGLOBIN AND HEMATOCRIT, BLOOD
HCT: 38.1 % — ABNORMAL LOW (ref 39.0–52.0)
Hemoglobin: 12.4 g/dL — ABNORMAL LOW (ref 13.0–17.0)

## 2020-01-18 MED ORDER — APIXABAN 5 MG PO TABS
10.0000 mg | ORAL_TABLET | Freq: Two times a day (BID) | ORAL | Status: DC
  Administered 2020-01-18: 10 mg via ORAL
  Filled 2020-01-18: qty 2

## 2020-01-18 MED ORDER — APIXABAN 5 MG PO TABS: 5.0000 mg | ORAL_TABLET | Freq: Two times a day (BID) | ORAL | Status: DC

## 2020-01-18 MED ORDER — APIXABAN 5 MG PO TABS: ORAL_TABLET | ORAL | 0 refills | Status: DC

## 2020-01-18 MED FILL — ELIQUIS STARTER PACK 5 MG T: 5 | 30 days supply | Qty: 74 | Fill #0

## 2020-01-18 NOTE — Discharge Instructions (Signed)
Information on my medicine - ELIQUIS (apixaban)  This medication education was reviewed with me or my healthcare representative as part of my discharge preparation.    Why was Eliquis prescribed for you? Eliquis was prescribed to treat blood clots that may have been found in the veins of your legs (deep vein thrombosis) or in your lungs (pulmonary embolism) and to reduce the risk of them occurring again.  What do You need to know about Eliquis ? The starting dose is 10 mg (two 5 mg tablets) taken TWICE daily for the FIRST SEVEN (7) DAYS, then on 01/25/20  the dose is reduced to ONE 5 mg tablet taken TWICE daily.  Eliquis may be taken with or without food.   Try to take the dose about the same time in the morning and in the evening. If you have difficulty swallowing the tablet whole please discuss with your pharmacist how to take the medication safely.  Take Eliquis exactly as prescribed and DO NOT stop taking Eliquis without talking to the doctor who prescribed the medication.  Stopping may increase your risk of developing a new blood clot.  Refill your prescription before you run out.  After discharge, you should have regular check-up appointments with your healthcare provider that is prescribing your Eliquis.    What do you do if you miss a dose? If a dose of ELIQUIS is not taken at the scheduled time, take it as soon as possible on the same day and twice-daily administration should be resumed. The dose should not be doubled to make up for a missed dose.  Important Safety Information A possible side effect of Eliquis is bleeding. You should call your healthcare provider right away if you experience any of the following: ? Bleeding from an injury or your nose that does not stop. ? Unusual colored urine (red or dark brown) or unusual colored stools (red or black). ? Unusual bruising for unknown reasons. ? A serious fall or if you hit your head (even if there is no bleeding).  Some  medicines may interact with Eliquis and might increase your risk of bleeding or clotting while on Eliquis. To help avoid this, consult your healthcare provider or pharmacist prior to using any new prescription or non-prescription medications, including herbals, vitamins, non-steroidal anti-inflammatory drugs (NSAIDs) and supplements.  This website has more information on Eliquis (apixaban): http://www.eliquis.com/eliquis/home

## 2020-01-18 NOTE — TOC Benefit Eligibility Note (Signed)
Transition of Care [facility]) Benefit Eligibility Note    Patient Details  Name: Calvin Clay MRN: [id number] Date of Birth: July 08, 1971   Medication/Dose: Eliquis5mg  bid  Covered?: Yes  Spoke with Person/Company/Phone Number:: [facility]  Co-Pay: $482.88 for 30 day retail  Prior Approval: No  Deductible: Unmet(patient has approx $3000 ded)  Additional Notes: patients coverage for all medications are based on deductible and co-insurance. patient is elegible for use of coupon card    Delorse Lek Phone Number: 01/18/2020, 3:10 PM

## 2020-01-18 NOTE — Discharge Summary (Signed)
Name: Calvin Clay MRN: [id number] DOB: [birth year] 49 y.o. PCP: Everardo Beals, NP  Date of Admission: 01/17/2020  1:08 AM Date of Discharge: 01/18/2020 Attending Physician: Lalla Brothers, MD FACP  Discharge Diagnosis: 1. Acute provoked pulmonary embolism 2. Acute kidney injury  Discharge Medications: Allergies as of 01/18/2020      Reactions   Iodine Anaphylaxis   Shrimp [shellfish Allergy] Swelling   Throat closing      Medication List    STOP taking these medications   ibuprofen 200 MG tablet Commonly known as: ADVIL     TAKE these medications   acetaminophen 650 MG CR tablet Commonly known as: TYLENOL Take 975 mg by mouth every 8 (eight) hours as needed for pain or fever.   albuterol 108 (90 Base) MCG/ACT inhaler Commonly known as: VENTOLIN HFA Inhale 2 puffs into the lungs every 6 (six) hours as needed for wheezing or shortness of breath.   apixaban 5 MG Tabs tablet Commonly known as: Eliquis Take 2 tablets (10mg ) twice daily for 7 days, then 1 tablet (5mg ) twice daily   budesonide-formoterol 160-4.5 MCG/ACT inhaler Commonly known as: SYMBICORT Inhale 2 puffs into the lungs 2 (two) times daily.   guaifenesin 100 MG/5ML syrup Commonly known as: ROBITUSSIN Take 200 mg by mouth 3 (three) times daily as needed for cough.       Disposition and follow-up:   Mr.Calvin Clay was discharged from [facility] in Good condition.  At the hospital follow up visit please address:  1.  Please ensure patient has prescription for Eliquis. He was provided with 30 day supply at discharge. We recommend treatment for minimum duration of 3 months. Please also check BMP to assess renal function.   Patient had an AKI on admission. Creatinine was within normal limits on discharge. Please check BMP at followup.  2.  Labs / imaging needed at time of follow-up: BMP to check renal function  3.  Pending labs/ test needing follow-up: None  Follow-up  Appointments: Follow-up Information    Everardo Beals, NP Follow up.   Why: Please call for an appointment within the next week Contact information: [place] [address] [phone]           Hospital Course by problem list:  # Provoked pulmonary embolism: Patient is a 49 year old male with past medical history significant for obesity, asthma, recent COVID-19 infection who presented to the ER on 01/17/2020 with acute hypoxic respiratory failure and was subsequently found to have a submassive pulmonary embolism on CTA.  CT angiogram also demonstrated increased RV to LV ratio, but EKG and echocardiogram were without evidence for right heart strain.  Troponins were negative x2.  The pulmonary embolism was likely provoked secondary to patient's COVID-19 infection (diagnosed on 1/25, symptoms started on 1/22).  On day of discharge, patient was without chest pain, was ambulating and oxygenating well on room air. We recommend anticoagulation for minimum of 3 months.  Patient was discharged with a 1 month supply of Eliquis and PCP follow-up.  # Acute Kidney Injury: Creatinine was initially elevated to 1.44 on admission, was within normal limits at 1.14 on day of discharge.  Discharge Vitals:   BP 122/87   Pulse 96   Temp 98.4 F (36.9 C) (Oral)   Resp 16   Ht 6\' 3"  (1.905 m)   Wt 136 kg   SpO2 94%   BMI 37.48 kg/m   Pertinent Labs, Studies, and Procedures:  CBC Latest Ref  Rng & Units 01/18/2020 01/18/2020 01/17/2020  WBC 4.0 - 10.5 K/uL - 7.7 9.7  Hemoglobin 13.0 - 17.0 g/dL 12.4(L) 12.1(L) 14.2  Hematocrit 39.0 - 52.0 % 38.1(L) 36.4(L) 43.5  Platelets 150 - 400 K/uL - 333 401(H)   BMP Latest Ref Rng & Units 01/18/2020 01/17/2020 01/14/2020  Glucose 70 - 99 mg/dL 92 104(H) 93  BUN 6 - 20 mg/dL 10 11 10   Creatinine 0.61 - 1.24 mg/dL 1.14 1.44(H) 1.19  Sodium 135 - 145 mmol/L 139 138 139  Potassium 3.5 - 5.1 mmol/L 4.0 4.1 4.2  Chloride 98 - 111 mmol/L 110 102  103  CO2 22 - 32 mmol/L 23 25 25   Calcium 8.9 - 10.3 mg/dL 8.3(L) 9.7 9.3   CT Angio Chest PE W or WO Contrast (01/17/20) IMPRESSION: 1. Acute bilateral right near occlusive lobar, segmental and subsegmental pulmonary emboli and left segmental and subsegmental pulmonary emboli. Positive for acute PE with CT evidence of right heart strain (RV/LV Ratio = 1.2.) consistent with at least submassive (intermediate risk) PE. The presence of right heart strain has been associated with an increased risk of morbidity and mortality. 2. Groundglass opacities and small nodules in the left upper lobe and lingula may reflect infection or inflammation. Attention on follow-up. 3. Scattered bilateral linear opacities and left basilar consolidation likely reflecting atelectasis.  TTE (01/17/20): IMPRESSIONS  1. Left ventricular ejection fraction, by estimation, is 55 to 60%. The  left ventricle has normal function. The left ventrical has no regional  wall motion abnormalities. Left ventricular diastolic parameters were  normal.  2. Right ventricular systolic function is normal. The right ventricular  size is normal.  3. The mitral valve is normal in structure and function. no evidence of  mitral valve regurgitation.  4. The aortic valve is normal in structure and function. Aortic valve  regurgitation is not visualized.   DVT Ultrasound (01/17/20): Summary:  RIGHT:  - There is no evidence of deep vein thrombosis in the lower extremity.  - No cystic structure found in the popliteal fossa.  LEFT:  - There is no evidence of deep vein thrombosis in the lower extremity.  - No cystic structure found in the popliteal fossa.   Discharge Instructions: Discharge Instructions    Diet - low sodium heart healthy   Complete by: As directed    Discharge instructions   Complete by: As directed    You were seen in the hospital for a blood clot in your lungs. We have provided you with a 1 month supply of  Eliquis. We want you to take 2 tablets (10 mg) twice daily for 7 days and then take 1 tablet (5 mg) twice daily. Please followup with your primary care doctor within the next week. Your doctor will need to provide you with further prescriptions for Eliquis. You should remain on Eliquis for at least 3 months.   Increase activity slowly   Complete by: As directed       Signed: Jeanmarie Hubert, MD 01/18/2020, 3:22 PM

## 2020-01-18 NOTE — Progress Notes (Signed)
PT Screen  PT screen completed. Pt ambulated the unit with RN and was able to maintain SaO2 >90%O2 on RA. Pt did not require AD or experience LoB. PT instructed pt in use of IS for d/c home. he patient has no acute PT needs or follow up needs at this time.  PT to sign off. Thank you for the referral.  Benjamine Mola B. Migdalia Dk PT, DPT Acute Rehabilitation Services Pager [phone] Office [phone]

## 2020-01-18 NOTE — Progress Notes (Signed)
Calvin Clay to be D/C'd Home per MD order.  Discussed with the patient and all questions fully answered.  VSS, Skin clean, dry and intact without evidence of skin break down, no evidence of skin tears noted. IV catheters discontinued intact. Site without signs and symptoms of complications. Dressing and pressure applied.  An After Visit Summary was printed and given to the patient. Patient received prescription.  D/c education completed with patient including follow up instructions, medication list, d/c activities limitations if indicated, with other d/c instructions as indicated by MD - patient able to verbalize understanding, all questions fully answered.   Patient instructed to return to ED, call 911, or call MD for any changes in condition.   Patient escorted via [place], and D/C home via private auto.  [name] 01/18/2020 3:57 PM

## 2020-01-18 NOTE — Progress Notes (Signed)
  Subjective:  Patient seen at bedside. Patient denies chest pain, shortness of breath. Patient states he is willing to take medications for as long as needed if it can help his health. Patient is able to explain in detail the results and recommendations provided to him.  Objective:   Vital Signs (last 24 hours): Vitals:   01/18/20 0400 01/18/20 0752 01/18/20 0800 01/18/20 1150  BP: 114/76 121/77 117/78 122/87  Pulse: 79 100 95 (!) 101  Resp: 12 19 20 13   Temp: 97.7 F (36.5 C) 98 F (36.7 C) 98 F (36.7 C) 98.4 F (36.9 C)  TempSrc: Oral Oral Oral Oral  SpO2: 95% 92% 94% 94%  Weight:      Height:       Physical Exam: General Alert and answers questions appropriately, no acute distress  Cardiac Regular rate and rhythm, no murmurs, rubs, or gallops  Pulmonary Clear to auscultation bilaterally without wheezes, rhonchi, or rales   CBC Latest Ref Rng & Units 01/18/2020 01/18/2020 01/17/2020  WBC 4.0 - 10.5 K/uL - 7.7 9.7  Hemoglobin 13.0 - 17.0 g/dL 12.4(L) 12.1(L) 14.2  Hematocrit 39.0 - 52.0 % 38.1(L) 36.4(L) 43.5  Platelets 150 - 400 K/uL - 333 401(H)   BMP Latest Ref Rng & Units 01/18/2020 01/17/2020 01/14/2020  Glucose 70 - 99 mg/dL 92 104(H) 93  BUN 6 - 20 mg/dL 10 11 10   Creatinine 0.61 - 1.24 mg/dL 1.14 1.44(H) 1.19  Sodium 135 - 145 mmol/L 139 138 139  Potassium 3.5 - 5.1 mmol/L 4.0 4.1 4.2  Chloride 98 - 111 mmol/L 110 102 103  CO2 22 - 32 mmol/L 23 25 25   Calcium 8.9 - 10.3 mg/dL 8.3(L) 9.7 9.3    Assessment/Plan:   Principal Problem:   Acute respiratory failure with hypoxia (HCC) Active Problems:   Acute pulmonary embolism (HCC)   AKI (acute kidney injury) [facility])  Patient is a 49 year old male with past medical history significant for obesity, asthma, and recent COVID-19 infection who presented to the ED with acute proximal respiratory failure and was subsequently diagnosed with submassive pulmonary embolism.  He was started on IV heparin and admitted for further  evaluation/management.  # Provoked pulmonary embolism: Patient was currently requiring 4 L nasal cannula but is now saturating well on room air.  CT angiogram demonstrated increased RV to LV ratio, but EKG and echocardiogram were without evidence for right heart strain.  Troponins were negative x2.  This pulmonary embolism was likely provoked secondary to patient's COVID-19 infection.  Would recommend anticoagulation for minimum 3 months. *Spoke with TOC and per insurance on file, patient would be able to have Eliquis *Will provide 30 day supply via [place] and instruct for PCP followup  # Previous COVID-19: Originally diagnosed on 1/25 but symptoms started on 1/22. Patient has completed quarantine and by [facility] recommendations no longer require quarantine.  # AKI: Creatinine of 1.44 on admission, now WNL. UA without significant abnormality  # Anemia: Hemoglobin of 12.1 from 14.2 yesterday, likely dilutional. Repeat H&H stable with hemoglobin of 12.4.  PT/OT: Consulted for imminent discharge, we appreciate their assistance with patient Diet: Regular DVT Ppx: On heparin, will transition to Eliquis Dispo: Anticipated discharge in approximately 0-1 days  Jeanmarie Hubert, MD 01/18/2020, 12:33 PM

## 2020-01-18 NOTE — Progress Notes (Addendum)
ANTICOAGULATION CONSULT NOTE - [place] for Heparin Indication: pulmonary embolus  Allergies  Allergen Reactions  . Iodine Anaphylaxis  . Shrimp [Shellfish Allergy] Swelling    Throat closing    Patient Measurements: Height: 6\' 3"  (190.5 cm) Weight: 299 lb 13.2 oz (136 kg) IBW/kg (Calculated) : 84.5 Heparin Dosing Weight: 114 kg  Vital Signs: Temp: 98 F (36.7 C) (02/12 0800) Temp Source: Oral (02/12 0800) BP: 117/78 (02/12 0800) Pulse Rate: 95 (02/12 0800)  Labs: Recent Labs    01/17/20 0115 01/17/20 0115 01/17/20 0315 01/17/20 1341 01/17/20 1638 01/17/20 2313 01/18/20 0235 01/18/20 0704  HGB 14.2   < >  --   --   --   --  12.1* 12.4*  HCT 43.5  --   --   --   --   --  36.4* 38.1*  PLT 401*  --   --   --   --   --  333  --   LABPROT 14.0  --   --   --   --   --   --   --   INR 1.1  --   --   --   --   --   --   --   HEPARINUNFRC  --   --   --   --  0.74* 0.48 0.59  --   CREATININE 1.44*  --   --   --   --   --  1.14  --   TROPONINIHS 5  --  3 5  --   --   --   --    < > = values in this interval not displayed.    Estimated Creatinine Clearance: 119.1 mL/min (by C-G formula based on SCr of 1.14 mg/dL).   Medical History: Past Medical History:  Diagnosis Date  . Asthma   . GERD (gastroesophageal reflux disease)   . Migraine     Assessment: 49 yr old male presented to the ED with SOB and CP.  He was recently diagnosed with COVID-19 infection. Pt was found to have bilateral PE with right heart strain. Baseline Hgb is WNL and platelets are elevated at 401. He is not on anticoagulation PTA.   Vascular U/S performed this afternoon did not reveal any evidence of DVT.  2/12 AM update:  Heparin level therapeutic after rate decrease at 0.59, Hgb decreased from 14.2>>12.4, Monitor for bleeding.   Goal of Therapy:  Heparin level 0.3-0.7 units/ml Monitor platelets by anticoagulation protocol: Yes   Plan:  Cont heparin at 1700  units/hr Heparin level with AM labs Monitor Hgb trend  Mardell Cragg A. Levada Dy, PharmD, BCPS, FNKF Clinical Pharmacist [place] Please utilize Amion for appropriate phone number to reach the unit pharmacist ([place])  Addendum: Patient to be transitioned to apixaban per MD. Will give full 7 days load with 10mg  PO BID and then 5mg  BID thereafter. Will d/c heparin drip once first dose is given. Please continue to monitor CBC as able.   Aleksander Edmiston A. Levada Dy, PharmD, BCPS, FNKF Clinical Pharmacist [place] Please utilize Amion for appropriate phone number to reach the unit pharmacist ([place])     Phone: [phone]

## 2020-01-18 NOTE — Progress Notes (Signed)
SATURATION QUALIFICATIONS: (This note is used to comply with regulatory documentation for home oxygen)  Patient Saturations on Room Air at Rest = 96%  Patient Saturations on Room Air while Ambulating = 92-94%  Patient Saturations on 0 Liters of oxygen while Amb-ulating = 92-94%  Please briefly explain why patient needs home oxygen: pt not meeting criteria for home O2

## 2020-01-18 NOTE — Progress Notes (Signed)
ANTICOAGULATION CONSULT NOTE - [place] for Heparin Indication: pulmonary embolus  Allergies  Allergen Reactions  . Iodine Anaphylaxis  . Shrimp [Shellfish Allergy] Swelling    Throat closing    Patient Measurements: Height: 6\' 3"  (190.5 cm) Weight: 299 lb 13.2 oz (136 kg) IBW/kg (Calculated) : 84.5 Heparin Dosing Weight: 114 kg  Vital Signs: Temp: 98.3 F (36.8 C) (02/11 1958) Temp Source: Oral (02/11 1958) BP: 120/80 (02/11 2200) Pulse Rate: 91 (02/11 2200)  Labs: Recent Labs    01/17/20 0115 01/17/20 0315 01/17/20 1341 01/17/20 1638 01/17/20 2313  HGB 14.2  --   --   --   --   HCT 43.5  --   --   --   --   PLT 401*  --   --   --   --   LABPROT 14.0  --   --   --   --   INR 1.1  --   --   --   --   HEPARINUNFRC  --   --   --  0.74* 0.48  CREATININE 1.44*  --   --   --   --   TROPONINIHS 5 3 5   --   --     Estimated Creatinine Clearance: 94.3 mL/min (A) (by C-G formula based on SCr of 1.44 mg/dL (H)).   Medical History: Past Medical History:  Diagnosis Date  . Asthma   . GERD (gastroesophageal reflux disease)   . Migraine     Assessment: 49 yr old male presented to the ED with SOB and CP.  He was recently diagnosed with COVID-19 infection. Pt was found to have bilateral PE with right heart strain. Baseline Hgb is WNL and platelets are elevated at 401. He is not on anticoagulation PTA.   Vascular U/S performed this afternoon did not reveal any evidence of DVT.  2/12 AM update:  Heparin level therapeutic after rate decrease  Goal of Therapy:  Heparin level 0.3-0.7 units/ml Monitor platelets by anticoagulation protocol: Yes   Plan:  Cont heparin at 1700 units/hr Confirmatory heparin level with AM labs  Narda Bonds, PharmD, [place] Pharmacist Phone: [phone]

## 2020-01-18 NOTE — Care Management (Signed)
CM acknowledges consult for medication assistance.  Per Epic pt has active insurance.  CM reviewed PTA medication and current MAR medications for potential free/reduced copay candidates - none found.  TOC will continue to follow    Update:  Pt to discharge home on Eliquis  - TOC will fill discharge prescriptions.  CM printed reduced copay card to unit - bedside nurse to provide to pt prior to discharge  CM unable to reach pt via phone - bedside nurse will discuss copay card with pt.  No other CM needs determined - CM signing off

## 2020-01-18 NOTE — Progress Notes (Signed)
Uneventful overnight!  Patient stated he was experiencing anxiety early in the evening, given Ativan, resolved.  Still on 4L nasal cannula.  No acute changes otherwise.  Patient called his wife on his cell phone.

## 2020-01-21 ENCOUNTER — Encounter (HOSPITAL_COMMUNITY): Payer: Self-pay | Admitting: Emergency Medicine

## 2020-01-21 ENCOUNTER — Emergency Department (HOSPITAL_COMMUNITY): Payer: BC Managed Care – PPO

## 2020-01-21 ENCOUNTER — Emergency Department (HOSPITAL_COMMUNITY)
Admission: EM | Admit: 2020-01-21 | Discharge: 2020-01-21 | Disposition: A | Discharge status: Home / Self Care | Payer: BC Managed Care – PPO | Attending: Emergency Medicine | Admitting: Emergency Medicine

## 2020-01-21 DIAGNOSIS — R079 Chest pain, unspecified: Secondary | ICD-10-CM | POA: Diagnosis not present

## 2020-01-21 DIAGNOSIS — J45909 Unspecified asthma, uncomplicated: Secondary | ICD-10-CM | POA: Diagnosis not present

## 2020-01-21 DIAGNOSIS — R0602 Shortness of breath: Secondary | ICD-10-CM | POA: Diagnosis present

## 2020-01-21 LAB — TROPONIN I (HIGH SENSITIVITY)
Troponin I (High Sensitivity): 5 ng/L (ref <18)
Troponin I (High Sensitivity): 2 ng/L (ref <18)

## 2020-01-21 LAB — BASIC METABOLIC PANEL
Anion gap: 10 (ref 5–15)
BUN: 9 mg/dL (ref 6–20)
CO2: 23 mmol/L (ref 22–32)
Calcium: 10.5 mg/dL — ABNORMAL HIGH (ref 8.9–10.3)
Chloride: 101 mmol/L (ref 98–111)
Creatinine, Ser: 1.14 mg/dL (ref 0.61–1.24)
GFR calc Af Amer: >60 mL/min (ref >60)
GFR calc non Af Amer: >60 mL/min (ref >60)
Glucose, Bld: 109 mg/dL — ABNORMAL HIGH (ref 70–99)
Potassium: 3.5 mmol/L (ref 3.5–5.1)
Sodium: 134 mmol/L — ABNORMAL LOW (ref 135–145)

## 2020-01-21 LAB — CBC
HCT: 43 % (ref 39.0–52.0)
Hemoglobin: 14.1 g/dL (ref 13.0–17.0)
MCH: 27.7 pg (ref 26.0–34.0)
MCHC: 32.8 g/dL (ref 30.0–36.0)
MCV: 84.5 fL (ref 80.0–100.0)
Platelets: 410 10*3/uL — ABNORMAL HIGH (ref 150–400)
RBC: 5.09 MIL/uL (ref 4.22–5.81)
RDW: 13.5 % (ref 11.5–15.5)
WBC: 7.3 10*3/uL (ref 4.0–10.5)
nRBC: 0 % (ref 0.0–0.2)

## 2020-01-21 LAB — BRAIN NATRIURETIC PEPTIDE: B Natriuretic Peptide: 16 pg/mL (ref 0.0–100.0)

## 2020-01-21 MED ORDER — HYDROXYZINE HCL 25 MG PO TABS
25.0000 mg | ORAL_TABLET | Freq: Once | ORAL | Status: AC
  Administered 2020-01-21: 25 mg via ORAL
  Filled 2020-01-21: qty 1

## 2020-01-21 MED ORDER — SODIUM CHLORIDE 0.9% FLUSH: 3.0000 mL | Freq: Once | INTRAVENOUS | Status: DC

## 2020-01-21 NOTE — Discharge Instructions (Signed)
Your work-up today is reassuring, you can expect to still experience some intermittent chest discomfort as the medicines and your body continue to work to dissolve the blood clots in your lungs.  Continue taking your Eliquis and follow-up with your primary care doctor.  If you begin having worsening chest pain or shortness of breath or any other new or concerning symptoms return to the emergency department.

## 2020-01-21 NOTE — ED Triage Notes (Signed)
Pt arrives to ED with sob hx of covid 19 on January 25th and tested + on 01/17/20 as well. Pt states he had a PE on 2/11 and currently taking eliquis. Pt is here today for chest pain that kept him up last night. Pt is breathing easily in triage, in no distress.

## 2020-01-21 NOTE — ED Provider Notes (Signed)
[facility] EMERGENCY DEPARTMENT Provider Note   CSN: [id number] Arrival date & time: 01/21/20  1039     History Chief Complaint  Patient presents with  . Shortness of Breath    Calvin Clay is a 49 y.o. male.  Calvin Clay is a 49 y.o. male with a history of recently diagnosed PE, asthma, GERD, migraine, who presents to the emergency department for evaluation of chest pain.  Patient was seen in the emergency department on 2/11 and diagnosed with PE after recent Covid infection, patient was admitted to the hospital for 2 days, discharged on Eliquis.  He returns today because over the past few days he has had some intermittent chest pains.  He describes it as a sharp central chest pain that comes and goes.  He states this is not nearly as severe as the pain he experienced when he presented with PE, but ever since he left the hospital he has had some intermittent central pains.  Chest pain is nonradiating.  He reports minimal occasional shortness of breath, but he is able to walk around without difficulty.  He denies any associated fever or cough.  He denies any lightheadedness or syncope.  No nausea, vomiting or diaphoresis.  No abdominal pain.  States that he has not missed any doses of his Eliquis.  He states that this pain makes him very anxious, he states he has never had a major health problem like this PE before and has been very concerned about it.  He does think that the anxiety certainly worsens the pain.  He is checked his pulse and oxygen levels at home and his oxygen levels have never dropped below 94%.  He has noted some intermittent tachycardia when he experiences the pain.  No issues with bleeding or bruising.  Patient did have bilateral lower extremity DVT studies during hospital admission which were negative, and he denies any new pain or swelling to his legs.  No other aggravating or alleviating factors.        Past Medical History:  Diagnosis Date  .  Asthma   . GERD (gastroesophageal reflux disease)   . Migraine     Patient Active Problem List   Diagnosis Date Noted  . Acute respiratory failure with hypoxia ([place])   . Acute pulmonary embolism ([employer])   . AKI (acute kidney injury) ([place])   . Chest pain, rule out acute myocardial infarction 08/23/2017  . Tremor 07/27/2017  . Severe persistent asthma without complication 123456  . Asthma exacerbation 06/30/2017  . Migraine 09/16/2012    Past Surgical History:  Procedure Laterality Date  . COSMETIC SURGERY    . ESOPHAGOGASTRODUODENOSCOPY  09/16/2012   Procedure: ESOPHAGOGASTRODUODENOSCOPY (EGD);  Surgeon: Ladene Artist, MD,FACG;  Location: [facility] ENDOSCOPY;  Service: Endoscopy;  Laterality: N/A;       Family History  Problem Relation Age of Onset  . Colon cancer Mother   . Colon cancer Father     Social History   Tobacco Use  . Smoking status: Never Smoker  . Smokeless tobacco: Never Used  Substance Use Topics  . Alcohol use: Yes    Comment: occasional  . Drug use: No    Home Medications Prior to Admission medications   Medication Sig Start Date End Date Taking? Authorizing Provider  acetaminophen (TYLENOL) 650 MG CR tablet Take 975 mg by mouth every 8 (eight) hours as needed for pain or fever.   Yes [provider]  albuterol (PROVENTIL HFA;VENTOLIN HFA) 108 (90  Base) MCG/ACT inhaler Inhale 2 puffs into the lungs every 6 (six) hours as needed for wheezing or shortness of breath. 07/27/17  Yes Elsie Stain, MD  apixaban (ELIQUIS) 5 MG TABS tablet Take 2 tablets (10mg ) twice daily for 7 days, then 1 tablet (5mg ) twice daily Patient taking differently: Take 5-10 mg by mouth 2 (two) times daily. Take 2 tablets (10mg ) twice daily for 7 days, then 1 tablet (5mg ) twice daily 01/18/20  Yes Jeanmarie Hubert, MD  guaifenesin (ROBITUSSIN) 100 MG/5ML syrup Take 200 mg by mouth 3 (three) times daily as needed for cough.   Yes [provider]    budesonide-formoterol (SYMBICORT) 160-4.5 MCG/ACT inhaler Inhale 2 puffs into the lungs 2 (two) times daily. 07/27/17   Elsie Stain, MD    Allergies    Iodine and Shrimp [shellfish allergy]  Review of Systems   Review of Systems  Constitutional: Negative for chills and fever.  HENT: Negative.   Respiratory: Negative for cough and shortness of breath.   Cardiovascular: Positive for chest pain. Negative for leg swelling.  Gastrointestinal: Negative for abdominal pain, diarrhea, nausea and vomiting.  Musculoskeletal: Negative for arthralgias and myalgias.  Skin: Negative for color change and rash.  Neurological: Negative for dizziness, syncope and light-headedness.    Physical Exam Updated Vital Signs BP (!) 144/95 (BP Location: Right Arm)   Pulse (!) 101   Temp 97.9 F (36.6 C) (Oral)   Resp 18   SpO2 98%   Physical Exam Vitals and nursing note reviewed.  Constitutional:      General: He is not in acute distress.    Appearance: He is well-developed. He is not ill-appearing or diaphoretic.     Comments: Well-appearing and in no distress  HENT:     Head: Normocephalic and atraumatic.  Eyes:     General:        Right eye: No discharge.        Left eye: No discharge.  Cardiovascular:     Rate and Rhythm: Normal rate and regular rhythm.     Pulses: Normal pulses.     Heart sounds: Normal heart sounds. No murmur. No friction rub. No gallop.   Pulmonary:     Effort: Pulmonary effort is normal. No respiratory distress.     Breath sounds: Normal breath sounds. No wheezing or rales.     Comments: Respirations equal and unlabored, patient able to speak in full sentences, lungs clear to auscultation bilaterally Chest:     Chest wall: No tenderness.  Abdominal:     General: Bowel sounds are normal. There is no distension.     Palpations: Abdomen is soft. There is no mass.     Tenderness: There is no abdominal tenderness. There is no guarding.     Comments: Abdomen soft,  nondistended, nontender to palpation in all quadrants without guarding or peritoneal signs  Musculoskeletal:        General: No deformity.     Cervical back: Neck supple.     Right lower leg: No tenderness. No edema.     Left lower leg: No tenderness. No edema.     Comments: Bilateral lower extremities without edema or tenderness  Skin:    General: Skin is warm and dry.     Capillary Refill: Capillary refill takes less than 2 seconds.  Neurological:     Mental Status: He is alert.     Coordination: Coordination normal.     Comments: Speech is clear, able  to follow commands Moves extremities without ataxia, coordination intact  Psychiatric:        Mood and Affect: Mood is anxious.        Behavior: Behavior normal.     ED Results / Procedures / Treatments   Labs (all labs ordered are listed, but only abnormal results are displayed) Labs Reviewed  BASIC METABOLIC PANEL - Abnormal; Notable for the following components:      Result Value   Sodium 134 (*)    Glucose, Bld 109 (*)    Calcium 10.5 (*)    All other components within normal limits  CBC - Abnormal; Notable for the following components:   Platelets 410 (*)    All other components within normal limits  BRAIN NATRIURETIC PEPTIDE  TROPONIN I (HIGH SENSITIVITY)  TROPONIN I (HIGH SENSITIVITY)    EKG EKG Interpretation  Date/Time:  Monday January 21 2020 10:50:28 EST Ventricular Rate:  106 PR Interval:  190 QRS Duration: 78 QT Interval:  334 QTC Calculation: 443 R Axis:   63 Text Interpretation: Sinus tachycardia Otherwise normal ECG Confirmed by Madalyn Rob [phone]) on 01/21/2020 12:18:56 PM   Radiology DG Chest Port 1 View  Result Date: 01/21/2020 CLINICAL DATA:  Chest pain.  Recent COVID-19 positive EXAM: PORTABLE CHEST 1 VIEW COMPARISON:  January 17, 2020 chest radiograph and chest CT FINDINGS: Lungs are clear. The heart size and pulmonary vascularity are normal. No adenopathy. There is degenerative  change in thoracic spine. IMPRESSION: Lungs clear.  No evident adenopathy. Electronically Signed   By: Lowella Grip III M.D.   On: 01/21/2020 13:12    Procedures Procedures (including critical care time)  Medications Ordered in ED Medications  sodium chloride flush (NS) 0.9 % injection 3 mL (3 mLs Intravenous Not Given 01/21/20 1300)  hydrOXYzine (ATARAX/VISTARIL) tablet 25 mg (25 mg Oral Given 01/21/20 1255)    ED Course  I have reviewed the triage vital signs and the nursing notes.  Pertinent labs & imaging results that were available during my care of the patient were reviewed by me and considered in my medical decision making (see chart for details).    MDM Rules/Calculators/A&P                      49 year old male who was recently diagnosed with PE presents with intermittent chest pains, these are much more mild compared to chest pain patient had on initial presentation with PE.  He has not missed any doses of his Eliquis and he has not been short of breath or hypoxic.  Patient does report significant anxiety surrounding recent PE diagnosis and intermittent chest pains.  He thinks his anxiety is worsening his symptoms.  On arrival he is well-appearing, minimally tachycardic at 101 but vitals otherwise normal he has not tachypneic or hypoxic on room air.  He has clear lungs and no chest tenderness on exam.  I discussed with patient that he can still have some chest pain, he likely still has some PE clot burden as he was initially diagnosed only a few days ago, patient did not realize this and this immediately reassured.  Will check chest x-ray and basic labs, will get troponin and BNP, if these were increasing this would indicate potential right heart strain from increasing clot burden.  I am reassured that the pain he is having today is much more mild compared to pain on his initial presentation with PE.  He has not missed any doses of his  anticoagulant.  Patient's chest x-ray is  clear, his EKG shows sinus tachycardia with no other concerning changes.  He has no leukocytosis and normal hemoglobin.  Sodium 134, glucose of 109, calcium minimally elevated at 10.5, no other electrolyte derangements and normal renal function.  BNP is not at all elevated, and troponins are normal x2, this is extremely reassuring and does not suggest increasing clot burden.  Patient's vitals have remained stable throughout stay, after reassurance he is feeling much better.  I suspect the intermittent pains he is having are related to the PE that is present but I do not think that repeat CT imaging is indicated I have low suspicion for worsening PE on anticoagulants.  Feel patient is stable for discharge home, divided patient education on how anticoagulants work and that he may still have some intermittent chest pain but provided strict return precautions.  He expresses understanding and agreement with plan.  Discharged home in good condition.  Case discussed with Dr. Roslynn Amble who was available to help guide patient's care and is in agreement with plan.  Final Clinical Impression(s) / ED Diagnoses Final diagnoses:  Central chest pain    Rx / DC Orders ED Discharge Orders    None       Jacqlyn Larsen, [place] 01/24/20 [id number]    Lucrezia Starch, MD 01/24/20 [phone]

## 2020-02-27 ENCOUNTER — Ambulatory Visit: Payer: Self-pay | Admitting: Cardiology

## 2020-03-11 DIAGNOSIS — R002 Palpitations: Secondary | ICD-10-CM | POA: Insufficient documentation

## 2020-03-11 NOTE — Progress Notes (Signed)
Patient referred by Calvin Beals, NP for chest pain  Subjective:   Calvin Clay, male    DOB: 03/20/1971, 49 y.o.   MRN: [id number]   Chief Complaint  Patient presents with  . Chest Pain  . New Patient (Initial Visit)     HPI  49 year old African American male with obesity, asthma, COVID-19 infection (12/2019), acute hypoxic respiratory failure 2/2 submassive pulmonary embolism (01/2020), referred for evaluation of chest pain.  While CTA showed Rt heart strain, with RV:LV ratio of 1.2, echocardiogram did not show any significant RV abnormalities. Troponins were negative x2.  The pulmonary embolism was likely provoked secondary to patient's COVID-19 infection (diagnosed on 1/25, symptoms started on 1/22). He was discharged on anticoagulation with eliquis for at least 3 months.  Since then, patient has experienced retrosternal chest pressure, usually after eating meals. However, he has also experienced chest tightness at times on walking uphill. He has been tying to increase his physical activity with walking on flat treadmill for 10 min. He is using incentive spirometer regularly. He denies any palpitations. His breathing has improved.   Past Medical History:  Diagnosis Date  . Asthma   . GERD (gastroesophageal reflux disease)   . Hyperlipidemia   . Hypertension   . Migraine      Past Surgical History:  Procedure Laterality Date  . COSMETIC SURGERY    . ESOPHAGOGASTRODUODENOSCOPY  09/16/2012   Procedure: ESOPHAGOGASTRODUODENOSCOPY (EGD);  Surgeon: Calvin Artist, MD,FACG;  [facility]: [facility] END[facility];  Service: Endoscopy;  Laterality: N/A;     Social History   Tobacco Use  Smoking Status Never Smoker  Smokeless Tobacco Never Used    Social History   Substance and Sexual Activity  Alcohol Use Not Currently     Family History  Problem Relation Age of Onset  . Asthma Mother   . Colon cancer Father   . Asthma Father   . Asthma Sister   . Asthma Brother       Current Outpatient Medications on File Prior to Visit  Medication Sig Dispense Refill  . acetaminophen (TYLENOL) 650 MG CR tablet Take 975 mg by mouth every 8 (eight) hours as needed for pain or fever.    Marland Kitchen albuterol (PROVENTIL HFA;VENTOLIN HFA) 108 (90 Base) MCG/ACT inhaler Inhale 2 puffs into the lungs every 6 (six) hours as needed for wheezing or shortness of breath. 1 Inhaler 2  . apixaban (ELIQUIS) 5 MG TABS tablet Take 2 tablets (40m) twice daily for 7 days, then 1 tablet (537m twice daily (Patient taking differently: Take 5-10 mg by mouth 2 (two) times daily. Take 2 tablets (1036mtwice daily for 7 days, then 1 tablet (5mg86mwice daily) 74 tablet 0  . budesonide-formoterol (SYMBICORT) 160-4.5 MCG/ACT inhaler Inhale 2 puffs into the lungs 2 (two) times daily. 1 Inhaler 11  . Calcium Carbonate Antacid (TUMS ULTRA 1000 PO) Take 1 tablet by mouth daily as needed.    . guMarland Kitchenifenesin (ROBITUSSIN) 100 MG/5ML syrup Take 200 mg by mouth 3 (three) times daily as needed for cough.    . LOMarland KitchenAZEPAM PO Take 0.5 tablets by mouth 3 (three) times daily as needed.    . metoprolol succinate (TOPROL-XL) 25 MG 24 hr tablet Take 25 mg by mouth daily.    . Multiple Vitamin (MULTIVITAMIN) tablet Take 2 tablets by mouth daily.    . omMarland Kitchenprazole (PRILOSEC) 20 MG capsule Take 20 mg by mouth daily.    . rosuvastatin (CRESTOR) 10 MG tablet Take 10 mg  by mouth daily.     No current facility-administered medications on file prior to visit.    Cardiovascular and other pertinent studies:  EKG 03/12/2020: Sinus rhythm 85 bpm. Normal EKG.   Echocardiogram 01/17/2020: 1. Left ventricular ejection fraction, by estimation, is 55 to 60%. The  left ventricle has normal function. The left ventrical has no regional  wall motion abnormalities. Left ventricular diastolic parameters were  normal.  2. Right ventricular systolic function is normal. The right ventricular  size is normal.  3. The mitral valve is normal in  structure and function. no evidence of  mitral valve regurgitation.  4. The aortic valve is normal in structure and function. Aortic valve  regurgitation is not visualized.   LE [place] 01/17/2020: No DVT  CTA 01/17/2020: 1. Acute bilateral right near occlusive lobar, segmental and subsegmental pulmonary emboli and left segmental and subsegmental pulmonary emboli. Positive for acute PE with CT evidence of right heart strain (RV/LV Ratio = 1.2.) consistent with at least submassive (intermediate risk) PE. The presence of right heart strain has been associated with an increased risk of morbidity and mortality. 2. Groundglass opacities and small nodules in the left upper lobe and lingula may reflect infection or inflammation. Attention on follow-up. 3. Scattered bilateral linear opacities and left basilar consolidation likely reflecting atelectasis.     Recent labs: 01/21/2020: Glucose 109, BUN/Cr 9/1.14. EGFR >60. Na/K 134/3.5.  H/H 14/43. MCV 84. Platelets 410 TSH 3.4 normal  Results for Calvin Clay (MRN [id number]) as of 03/11/2020 11:07  Ref. Range 01/17/2020 01:15 01/17/2020 03:15 01/17/2020 10:11 01/17/2020 13:41 01/18/2020 02:35 01/21/2020 11:02 01/21/2020 13:12  B Natriuretic Peptide Latest Ref Range: 0.0 - 100.0 pg/mL       16.0  Troponin I (High Sensitivity) Latest Ref Range: <18 ng/L 5 3  5  5 2      Review of Systems  Cardiovascular: Positive for chest pain. Negative for dyspnea on exertion, leg swelling, palpitations and syncope.         Vitals:   03/12/20 1038 03/12/20 1046  BP: (!) 128/103 140/78  Pulse: 89   Temp: 97.8 F (36.6 C)      Body mass index is 37.31 kg/m. Filed Weights   03/12/20 1038  Weight: 298 lb 8 oz (135.4 kg)     Objective:   Physical Exam  Constitutional: He appears well-developed and well-nourished.  Neck: No JVD present.  Cardiovascular: Normal rate, regular rhythm, normal heart sounds and intact distal pulses.  No murmur heard.  Pulmonary/Chest: Effort normal and breath sounds normal. He has no wheezes. He has no rales.  Musculoskeletal:        General: No edema.  Nursing note and vitals reviewed.          Assessment & Recommendations:   49 year old African American male with obesity, asthma, COVID-19 infection (12/2019), acute hypoxic respiratory failure 2/2 submassive pulmonary embolism (01/2020), referred for evaluation of chest pain.  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 I will obtain exercise treadmill stress test to evaluate his exercise capacity and any evidence of ischemia. I will also obtain an echocardiogram. Although he had normal echocardiogram at the time of his PE diagnosis, it will be prudent to repeat an echocardiogram to evaluate for any late development of pulmonary hypertension. Finally, I recommend 6 months of anticoagulation given his PE, most likely provoked by COVID.  If all above tests are normal, but he continues to have symptoms, could consider cardiopulmonary rehab.  Thank you for referring the patient to [place]. Please feel free to contact with any questions.  Nigel Mormon, MD [facility] Pager: [phone] Office: [phone]

## 2020-03-12 ENCOUNTER — Other Ambulatory Visit: Payer: Self-pay

## 2020-03-12 ENCOUNTER — Encounter: Payer: Self-pay | Admitting: Cardiology

## 2020-03-12 ENCOUNTER — Ambulatory Visit: Payer: BC Managed Care – PPO | Admitting: Cardiology

## 2020-03-12 VITALS — BP 140/78 | HR 89 | Temp 97.8°F | Ht 75.0 in | Wt 298.5 lb

## 2020-03-12 DIAGNOSIS — Z86711 Personal history of pulmonary embolism: Secondary | ICD-10-CM | POA: Insufficient documentation

## 2020-03-12 DIAGNOSIS — R0609 Other forms of dyspnea: Secondary | ICD-10-CM

## 2020-03-12 DIAGNOSIS — R0789 Other chest pain: Secondary | ICD-10-CM

## 2020-03-12 DIAGNOSIS — Z8616 Personal history of COVID-19: Secondary | ICD-10-CM

## 2020-03-24 ENCOUNTER — Ambulatory Visit: Payer: BC Managed Care – PPO

## 2020-03-24 ENCOUNTER — Other Ambulatory Visit: Payer: BC Managed Care – PPO

## 2020-03-24 DIAGNOSIS — R0789 Other chest pain: Secondary | ICD-10-CM

## 2020-03-28 ENCOUNTER — Other Ambulatory Visit: Payer: Self-pay

## 2020-03-28 ENCOUNTER — Ambulatory Visit: Payer: BC Managed Care – PPO

## 2020-03-28 DIAGNOSIS — R0789 Other chest pain: Secondary | ICD-10-CM

## 2020-03-31 NOTE — Progress Notes (Signed)
Called pt no answer, could not leave a vm.

## 2020-04-03 ENCOUNTER — Other Ambulatory Visit: Payer: Self-pay | Admitting: Physician Assistant

## 2020-04-03 ENCOUNTER — Other Ambulatory Visit (HOSPITAL_COMMUNITY): Payer: Self-pay | Admitting: Physician Assistant

## 2020-04-03 DIAGNOSIS — I2699 Other pulmonary embolism without acute cor pulmonale: Secondary | ICD-10-CM

## 2020-04-10 ENCOUNTER — Ambulatory Visit (HOSPITAL_COMMUNITY)
Admission: RE | Admit: 2020-04-10 | Discharge: 2020-04-10 | Disposition: A | Discharge status: Home / Self Care | Payer: BC Managed Care – PPO | Source: Ambulatory Visit | Attending: Physician Assistant | Admitting: Physician Assistant

## 2020-04-10 ENCOUNTER — Other Ambulatory Visit: Payer: Self-pay

## 2020-04-10 DIAGNOSIS — I2699 Other pulmonary embolism without acute cor pulmonale: Secondary | ICD-10-CM | POA: Insufficient documentation

## 2020-04-10 MED ORDER — IOHEXOL 350 MG/ML SOLN
80.0000 mL | Freq: Once | INTRAVENOUS | Status: AC | PRN
  Administered 2020-04-10: 80 mL via INTRAVENOUS

## 2020-05-27 IMAGING — DX DG CHEST 1V PORT
1 series · 1 of 1 positions shown · non-contrast
Comparison: 12/17/2018

CLINICAL DATA: Shortness of breath

EXAM:
PORTABLE CHEST 1 VIEW

[chest]
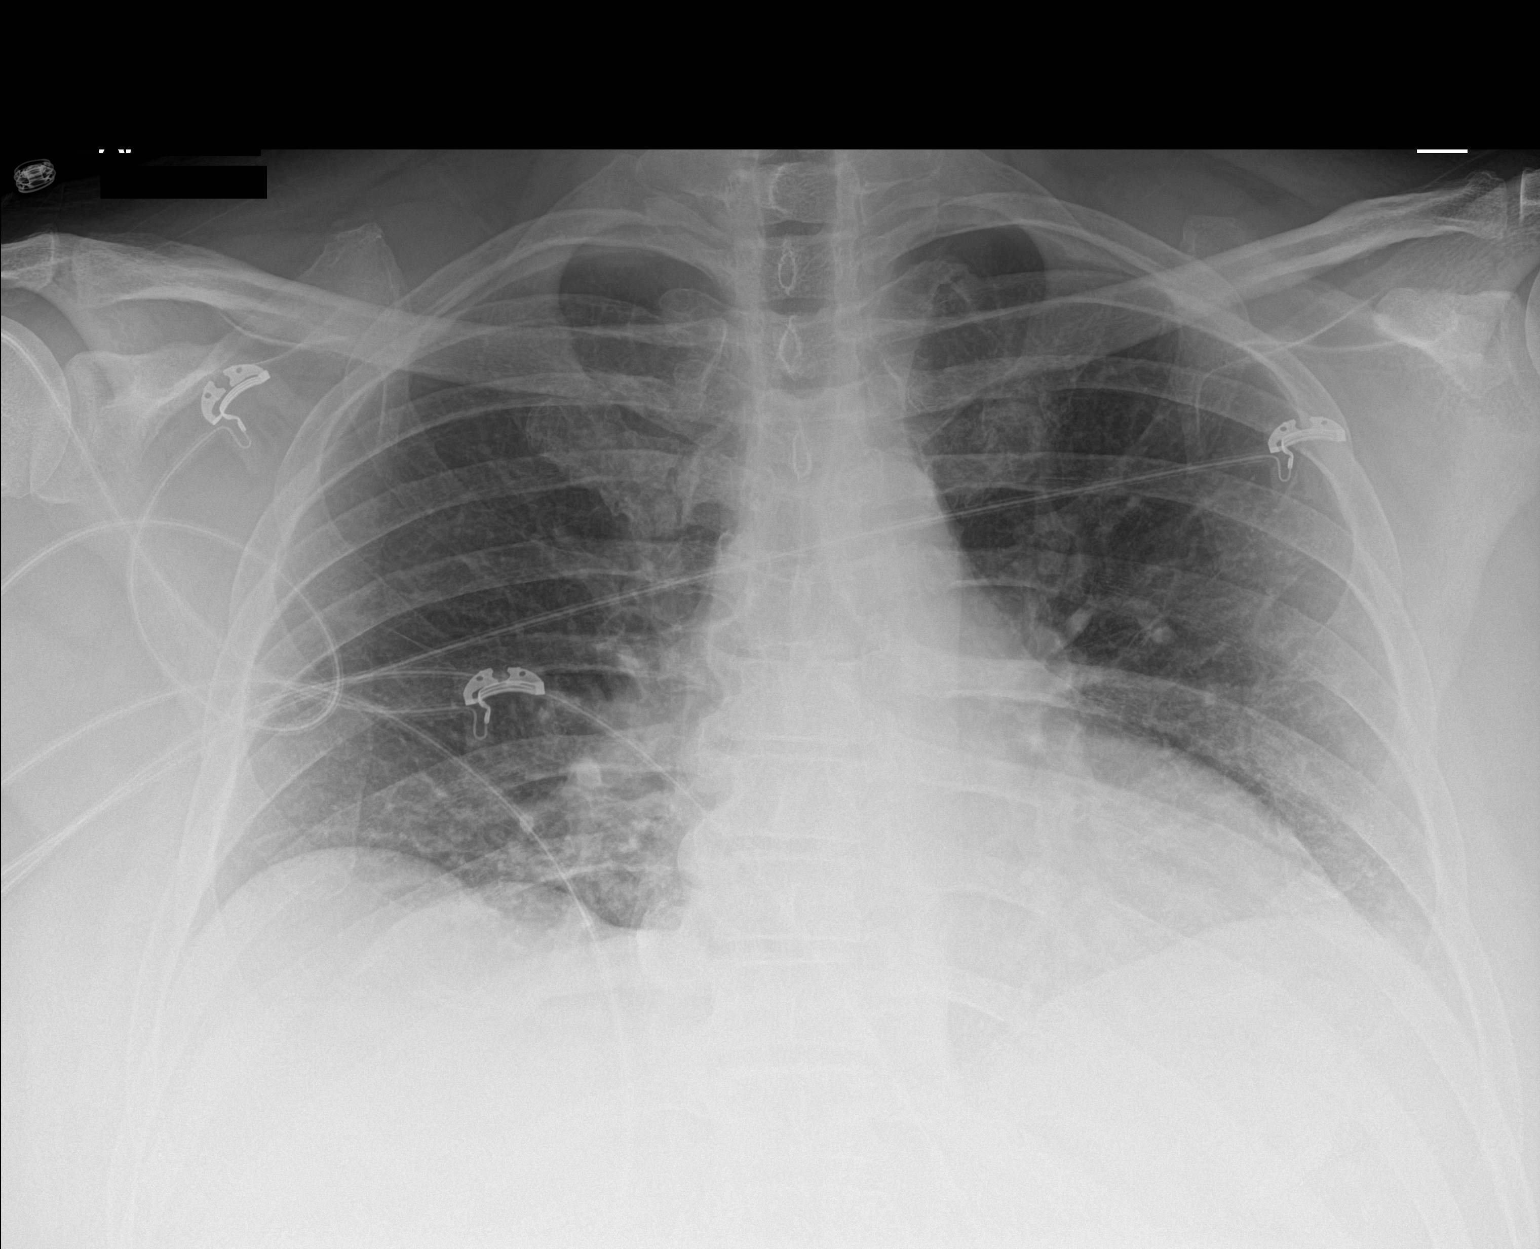

[1 of 1 positions shown; findings below may reference images not displayed]

FINDINGS: The heart size and mediastinal contours are within normal limits.
Both lungs are clear. The visualized skeletal structures are
unremarkable.
IMPRESSION: No active disease.

## 2021-01-26 ENCOUNTER — Other Ambulatory Visit (HOSPITAL_COMMUNITY): Payer: Self-pay | Admitting: Physician Assistant

## 2021-01-26 ENCOUNTER — Other Ambulatory Visit: Payer: Self-pay | Admitting: Physician Assistant

## 2021-01-26 DIAGNOSIS — R1011 Right upper quadrant pain: Secondary | ICD-10-CM

## 2021-01-30 ENCOUNTER — Other Ambulatory Visit: Payer: Self-pay

## 2021-01-30 ENCOUNTER — Ambulatory Visit (HOSPITAL_COMMUNITY)
Admission: RE | Admit: 2021-01-30 | Discharge: 2021-01-30 | Disposition: A | Discharge status: Home / Self Care | Payer: BC Managed Care – PPO | Source: Ambulatory Visit | Attending: Physician Assistant | Admitting: Physician Assistant

## 2021-01-30 DIAGNOSIS — R1011 Right upper quadrant pain: Secondary | ICD-10-CM | POA: Insufficient documentation

## 2021-03-11 ENCOUNTER — Encounter: Payer: Self-pay | Admitting: Nurse Practitioner

## 2021-03-19 ENCOUNTER — Other Ambulatory Visit: Payer: Self-pay

## 2021-03-27 ENCOUNTER — Ambulatory Visit (INDEPENDENT_AMBULATORY_CARE_PROVIDER_SITE_OTHER): Payer: BC Managed Care – PPO | Admitting: Nurse Practitioner

## 2021-03-27 ENCOUNTER — Encounter: Payer: Self-pay | Admitting: Nurse Practitioner

## 2021-03-27 VITALS — BP 112/60 | HR 100 | Ht 72.75 in | Wt 329.5 lb

## 2021-03-27 DIAGNOSIS — Z1211 Encounter for screening for malignant neoplasm of colon: Secondary | ICD-10-CM

## 2021-03-27 DIAGNOSIS — R1011 Right upper quadrant pain: Secondary | ICD-10-CM

## 2021-03-27 NOTE — Patient Instructions (Signed)
If you are age 48 or older, your body mass index should be between 23-30. Your Body mass index is 43.77 kg/m. If this is out of the aforementioned range listed, please consider follow up with your Primary Care Provider.  If you are age 40 or younger, your body mass index should be between 19-25. Your Body mass index is 43.77 kg/m. If this is out of the aformentioned range listed, please consider follow up with your Primary Care Provider.   You have been scheduled for an endoscopy. Please follow written instructions given to you at your visit today. If you use inhalers (even only as needed), please bring them with you on the day of your procedure.  Continue Omeprazole 20 mg once daily .  Call office if symptoms worsen.  Thank you for choosing me and [facility].  Carl Best, NP

## 2021-03-27 NOTE — Progress Notes (Signed)
Reviewed and agree with management plan. If I do not have availability for a colonoscopy and EGD in a timely fashion please schedule with a gastroenterologist with better availability.   Pricilla Riffle. Fuller Plan, MD FACG [phone]

## 2021-03-27 NOTE — Progress Notes (Addendum)
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  Past right elbow tendon surgery and plastic surgery to the right side of his forehead.  He presents to our office today as referred by Shanon Rosser, PA-C for further evaluation regarding right upper quadrant abdominal pain with associated abdominal gas which started approximately 5 months ago.  He describes having right upper quadrant abdominal pain which radiates in between the shoulder blades at times.  No nausea or vomiting.  Infrequent heartburn.  Increased belching.  He was prescribed Tramadol for his upper back pain with relief.  He was prescribed Omeprazole 20 mg daily which she takes for few days on and off for the past month.  Overall, he is having less upper abdominal pain over the past few weeks.  He continues to have mild epigastric to right upper quadrant discomfort which is more noticeable when eating, no specific food triggers.  He feels gas moving around his stomach at times.  He takes Ibuprofen 800 mg once monthly for aches and pains. No lower abdominal pain.  He is passing a normal formed brown bowel movement daily.  History of an upper GI bleed secondary to a Mallory-Weiss tear at the GE junction confirmed by EGD while in the hospital 09/16/2012 by Dr. Fuller Plan.  Father with history of colon cancer. He denies ever having a screening colonoscopy.  He has gained 20 pounds in the past year.  He underwent a right upper quadrant abdominal ultrasound 01/30/2021 which showed a normal gallbladder and hepatic steatosis, the study was markedly limited with nonvisualization of the common bile duct and left hepatic  lobe.  Laboratory studies 01/12/2021 showed a lipase level of 15.  Total bili 0.5.  Alk phos 85.  AST 25.  ALT 28.  BUN 15.  Creatinine 1.0.  Sodium 138.  Potassium 4.5.  Glucose 72.  WBC 9.7.  Hemoglobin 15.2.  Hematocrit 46.6.  Platelet 332.  Cardiac stress test 03/24/2020: Exercise treadmill stress test performed using Bruce protocol.  Patient reached 5.5 METS, and 88% of age predicted maximum heart rate.  Exercise capacity was low.  Non-limiting chest pain reported. Norma heart rate and hemodynamic response. Stress EKG revealed no ischemic changes. Intermediate risk study, due to low exercise capacity and non-limiting chest pain.    Repeat ECHO 03/28/2020: 1. Left ventricle cavity is normal in size. Moderate concentric hypertrophy of the left ventricle. Normal global wall motion. Normal LV systolic function with EF 57%. Normal diastolic filling pattern. 2. No significant valvular abnormality. Normal right atrial pressure 3. No significant change compared to previous study in 01/2020.   TTE (01/17/20): IMPRESSIONS  1. Left ventricular ejection fraction, by estimation, is 55 to 60%. The  left ventricle has normal function. The left ventrical has no regional  wall motion abnormalities. Left ventricular diastolic parameters were  normal.  2. Right ventricular systolic function is normal. The right ventricular  size is normal.  3. The mitral valve is normal in structure and function. no evidence of  mitral valve regurgitation.  4. The aortic valve is normal in structure and function. Aortic valve  regurgitation is not visualized.     EGD 09/16/2012  by Dr. Fuller Plan: 1.  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  Duodenitis, erosive, mild  Past Medical History:  Diagnosis Date  . Asthma   . COVID-19   . GERD (gastroesophageal reflux disease)   . Hyperlipidemia   . Hypertension   . Migraine    Past Surgical History:  Procedure Laterality Date  . COSMETIC SURGERY     . ESOPHAGOGASTRODUODENOSCOPY  09/16/2012   Procedure: ESOPHAGOGASTRODUODENOSCOPY (EGD);  Surgeon: Ladene Artist, MD,FACG;  Location: [facility] ENDOSCOPY;  Service: Endoscopy;  Laterality: N/A;   Social History: He is married.  He has 1 son and 1 daughter.  He is employed in administration in loss prevention.  Non-smoker.  He drinks 1 alcoholic beverage weekly or less.  No drug use.  Family History: Father with history colon cancer, required surgery and MS. Mother with history of ovarian cancer.    Allergies  Allergen Reactions  . Iodine Anaphylaxis and Itching  . Shrimp [Shellfish Allergy] Swelling    Throat closing      Outpatient Encounter Medications as of 03/27/2021  Medication Sig  . acetaminophen (TYLENOL) 650 MG CR tablet Take 975 mg by mouth every 8 (eight) hours as needed for pain or fever.  Marland Kitchen albuterol (PROVENTIL HFA;VENTOLIN HFA) 108 (90 Base) MCG/ACT inhaler Inhale 2 puffs into the lungs every 6 (six) hours as needed for wheezing or shortness of breath.  . budesonide-formoterol (SYMBICORT) 160-4.5 MCG/ACT inhaler Inhale 2 puffs into the lungs 2 (two) times daily.  Marland Kitchen LORazepam (ATIVAN) 1 MG tablet Take 0.5-1 mg by mouth 2 (two) times daily as needed.  . Multiple Vitamin (MULTIVITAMIN) tablet Take 2 tablets by mouth daily.  Marland Kitchen omeprazole (PRILOSEC) 20 MG capsule Take 20 mg by mouth daily.  . traMADol-acetaminophen (ULTRACET) 37.5-325 MG tablet Take by mouth.  . [DISCONTINUED] apixaban (ELIQUIS) 5 MG TABS tablet Take 2 tablets (93m) twice daily for 7 days, then 1 tablet (569m twice daily (Patient taking differently: Take 5-10 mg by mouth 2 (two) times daily. Take 2 tablets (1062mtwice daily for 7 days, then 1 tablet (5mg94mwice daily)  . [DISCONTINUED] Calcium Carbonate Antacid (TUMS ULTRA 1000 PO) Take 1 tablet by mouth daily as needed.  . [DISCONTINUED] guaifenesin (ROBITUSSIN) 100 MG/5ML syrup Take 200 mg by mouth 3 (three) times daily as needed for cough.  . [DISCONTINUED]  LORAZEPAM PO Take 0.5 tablets by mouth 3 (three) times daily as needed.  . [DISCONTINUED] methocarbamol (ROBAXIN) 500 MG tablet methocarbamol 500 mg tablet  TAKE 2 TABLETS BY MOUTH FOUR TIMES DAILY AS NEEDED FOR MUSCLE SPASM  . [DISCONTINUED] metoprolol succinate (TOPROL-XL) 25 MG 24 hr tablet Take 25 mg by mouth daily.  . [DISCONTINUED] rosuvastatin (CRESTOR) 10 MG tablet Take 10 mg by mouth daily.   No facility-administered encounter medications on file as of 03/27/2021.    REVIEW OF SYSTEMS: Gen: Denies fever, sweats or chills. No weight loss.  CV: Denies chest pain, palpitations or edema. Resp: Denies cough, shortness of breath of hemoptysis.  GI: See HPI  GU : Denies urinary burning, blood in urine, increased urinary frequency or incontinence. MS: Denies joint pain, muscles aches or weakness. Derm: Denies rash, itchiness, skin lesions or unhealing ulcers. Psych: + anxiety post Covid 19 infection.  Heme: Denies bruising, bleeding. Neuro:  Denies headaches, dizziness or paresthesias. Endo:  Denies any problems with DM, thyroid or adrenal function.  PHYSICAL EXAM: BP 112/60 (BP Location: Left Arm, Patient Position: Sitting, Cuff Size: Large)   Pulse 100  Ht 6' 0.75" (1.848 m) Comment: height measured without shoes  Wt (!) 329 lb 8 oz (149.5 kg)   BMI 43.77 kg/m  General: Obese 50 year old male in no acute distress. Head: Normocephalic and atraumatic. Eyes:  Sclerae non-icteric, conjunctive pink. Ears: Normal auditory acuity. Mouth: Dentition intact. No ulcers or lesions.  Neck: Supple, no lymphadenopathy or thyromegaly.  Lungs: Clear bilaterally to auscultation without wheezes, crackles or rhonchi. Heart: Regular rate and rhythm. No murmur, rub or gallop appreciated.  Abdomen: Soft, non distended. Mild RUQ tenderness at the costal margin. No masses. No hepatosplenomegaly. Normoactive bowel sounds x 4 quadrants.  Rectal: Deferred.  Musculoskeletal: Symmetrical with no gross  deformities. Skin: Warm and dry. No rash or lesions on visible extremities. Extremities: No edema. Neurological: Alert oriented x 4, no focal deficits.  Psychological:  Alert and cooperative. Normal mood and affect.  ASSESSMENT AND PLAN:  36.  50 year old male with epigastric and right upper quadrant abdominal pain which intermittently radiated between the shoulder blades for the past 5 months with improvement over the past month.  He started taking omeprazole 20 mg 2 to 3 days weekly 4 weeks ago.  Normal LFTs. -EGD benefits and risks discussed including risk with sedation, risk of bleeding, perforation and infection  -Omeprazole 20 mg daily -I discussed scheduling an abdominal/pelvic CT if his upper abdominal pain worsens.  I also discussed scheduling a CCK HIDA scan if he has persistent upper abdominal pain which radiates between the shoulder blades to assess for gallbladder dyskinesia  2.  Colon cancer screening.  Family history (Father) with colon cancer. -Colonoscopy benefits and risks discussed including risk with sedation, risk of bleeding, perforation and infection   3.  History of COVID-19 12/2019  4. PE post Covid 19 infection 01/2020 on Eliquis x 6 to 8 months. No longer on anticoagulation.   5. Hepatic steatosis. Normal LFTs.   Further recommendations to be determined after the above evaluation completed            CC:  Long, Scott, PA-C

## 2021-03-30 ENCOUNTER — Encounter: Payer: Self-pay | Admitting: Gastroenterology

## 2021-04-01 ENCOUNTER — Other Ambulatory Visit: Payer: Self-pay | Admitting: Gastroenterology

## 2021-04-01 ENCOUNTER — Encounter: Payer: Self-pay | Admitting: Gastroenterology

## 2021-04-01 ENCOUNTER — Ambulatory Visit (AMBULATORY_SURGERY_CENTER): Payer: BC Managed Care – PPO | Admitting: Gastroenterology

## 2021-04-01 ENCOUNTER — Other Ambulatory Visit: Payer: Self-pay

## 2021-04-01 VITALS — BP 116/76 | HR 84 | Temp 98.7°F | Resp 13 | Ht 72.75 in | Wt 329.0 lb

## 2021-04-01 DIAGNOSIS — D124 Benign neoplasm of descending colon: Secondary | ICD-10-CM | POA: Diagnosis not present

## 2021-04-01 DIAGNOSIS — Z800 Family history of malignant neoplasm of digestive organs: Secondary | ICD-10-CM

## 2021-04-01 DIAGNOSIS — K222 Esophageal obstruction: Secondary | ICD-10-CM

## 2021-04-01 DIAGNOSIS — D120 Benign neoplasm of cecum: Secondary | ICD-10-CM | POA: Diagnosis not present

## 2021-04-01 DIAGNOSIS — R1011 Right upper quadrant pain: Secondary | ICD-10-CM

## 2021-04-01 DIAGNOSIS — Z1211 Encounter for screening for malignant neoplasm of colon: Secondary | ICD-10-CM | POA: Diagnosis not present

## 2021-04-01 DIAGNOSIS — D123 Benign neoplasm of transverse colon: Secondary | ICD-10-CM

## 2021-04-01 DIAGNOSIS — K449 Diaphragmatic hernia without obstruction or gangrene: Secondary | ICD-10-CM | POA: Diagnosis not present

## 2021-04-01 DIAGNOSIS — K319 Disease of stomach and duodenum, unspecified: Secondary | ICD-10-CM

## 2021-04-01 MED ORDER — DICYCLOMINE HCL 10 MG PO CAPS: 10.0000 mg | ORAL_CAPSULE | Freq: Four times a day (QID) | ORAL | 11 refills | Status: DC | PRN

## 2021-04-01 MED ORDER — SODIUM CHLORIDE 0.9 % IV SOLN: 500.0000 mL | Freq: Once | INTRAVENOUS | Status: DC

## 2021-04-01 NOTE — Progress Notes (Signed)
VS-CW 

## 2021-04-01 NOTE — Patient Instructions (Signed)
YOU HAD AN ENDOSCOPIC PROCEDURE TODAY AT THE [facility]:   Refer to the procedure report that was given to you for any specific questions about what was found during the examination.  If the procedure report does not answer your questions, please call your gastroenterologist to clarify.  If you requested that your care partner not be given the details of your procedure findings, then the procedure report has been included in a sealed envelope for you to review at your convenience later.  YOU SHOULD EXPECT: Some feelings of bloating in the abdomen. Passage of more gas than usual.  Walking can help get rid of the air that was put into your GI tract during the procedure and reduce the bloating. If you had a lower endoscopy (such as a colonoscopy or flexible sigmoidoscopy) you may notice spotting of blood in your stool or on the toilet paper. If you underwent a bowel prep for your procedure, you may not have a normal bowel movement for a few days.  Please Note:  You might notice some irritation and congestion in your nose or some drainage.  This is from the oxygen used during your procedure.  There is no need for concern and it should clear up in a day or so.  SYMPTOMS TO REPORT IMMEDIATELY:   Following lower endoscopy (colonoscopy or flexible sigmoidoscopy):  Excessive amounts of blood in the stool  Significant tenderness or worsening of abdominal pains  Swelling of the abdomen that is new, acute  Fever of 100F or higher   Following upper endoscopy (EGD)  Vomiting of blood or coffee ground material  New chest pain or pain under the shoulder blades  Painful or persistently difficult swallowing  New shortness of breath  Fever of 100F or higher  Black, tarry-looking stools  For urgent or emergent issues, a gastroenterologist can be reached at any hour by calling [phone]. Do not use MyChart messaging for urgent concerns.    DIET:  We do recommend a small meal at first, but  then you may proceed to your regular diet.  Drink plenty of fluids but you should avoid alcoholic beverages for 24 hours.  ACTIVITY:  You should plan to take it easy for the rest of today and you should NOT DRIVE or use heavy machinery until tomorrow (because of the sedation medicines used during the test).    FOLLOW UP: Our staff will call the number listed on your records 48-72 hours following your procedure to check on you and address any questions or concerns that you may have regarding the information given to you following your procedure. If we do not reach you, we will leave a message.  We will attempt to reach you two times.  During this call, we will ask if you have developed any symptoms of COVID 19. If you develop any symptoms (ie: fever, flu-like symptoms, shortness of breath, cough etc.) before then, please call [phone].  If you test positive for Covid 19 in the 2 weeks post procedure, please call and report this information to us.    If any biopsies were taken you will be contacted by phone or by letter within the next 1-3 weeks.  Please call us at [phone] if you have not heard about the biopsies in 3 weeks.    SIGNATURES/CONFIDENTIALITY: You and/or your care partner have signed paperwork which will be entered into your electronic medical record.  These signatures attest to the fact that that the information above on   your After Visit Summary has been reviewed and is understood.  Full responsibility of the confidentiality of this discharge information lies with you and/or your care-partner. 

## 2021-04-01 NOTE — Progress Notes (Signed)
Called to room to assist during endoscopic procedure.  Patient ID and intended procedure confirmed with present staff. Received instructions for my participation in the procedure from the performing physician.  

## 2021-04-01 NOTE — Progress Notes (Signed)
1441 Robinul 0.1 mg IV given due large amount of secretions upon assessment.  MD made aware, vss  °

## 2021-04-01 NOTE — Progress Notes (Signed)
Report given to PACU, vss 

## 2021-04-01 NOTE — Progress Notes (Signed)
1507 HR > 100 with esmolol 25 mg given IV, MD updated, vss

## 2021-04-01 NOTE — Op Note (Signed)
[place] Patient Name: Calvin Clay Procedure Date: 04/01/2021 2:38 PM MRN: [id number] Endoscopist: Ladene Artist , MD Age: 50 Referring MD:  Date of Birth: 18-Feb-1971 Gender: Male Account #: [id number] Procedure:                Upper GI endoscopy Indications:              Abdominal pain in the right upper quadrant Medicines:                Monitored Anesthesia Care Procedure:                Pre-Anesthesia Assessment:                           - Prior to the procedure, a History and Physical                            was performed, and patient medications and                            allergies were reviewed. The patient's tolerance of                            previous anesthesia was also reviewed. The risks                            and benefits of the procedure and the sedation                            options and risks were discussed with the patient.                            All questions were answered, and informed consent                            was obtained. Prior Anticoagulants: The patient has                            taken no previous anticoagulant or antiplatelet                            agents. ASA Grade Assessment: III - A patient with                            severe systemic disease. After reviewing the risks                            and benefits, the patient was deemed in                            satisfactory condition to undergo the procedure.                           After obtaining informed consent, the endoscope was  passed under direct vision. Throughout the                            procedure, the patient's blood pressure, pulse, and                            oxygen saturations were monitored continuously. The                            Endoscope was introduced through the mouth, and                            advanced to the second part of duodenum. The upper                            GI  endoscopy was accomplished without difficulty.                            The patient tolerated the procedure well. Scope In: Scope Out: Findings:                 One benign-appearing, intrinsic mild stenosis was                            found at the gastroesophageal junction. This                            stenosis measured 1.6 cm (inner diameter) x less                            than one cm (in length). The stenosis was traversed.                           The exam of the esophagus was otherwise normal.                           A small hiatal hernia was present.                           A single localized small erosion with no bleeding                            and no stigmata of recent bleeding was found in the                            prepyloric region of the stomach. Biopsies were                            taken with a cold forceps for histology.                           The exam of the stomach was otherwise normal.  The duodenal bulb and second portion of the                            duodenum were normal. Complications:            No immediate complications. Estimated Blood Loss:     Estimated blood loss was minimal. Impression:               - Benign-appearing esophageal stenosis.                           - Small hiatal hernia.                           - Erosive gastropathy with no bleeding and no                            stigmata of recent bleeding. Biopsied.                           - Normal duodenal bulb and second portion of the                            duodenum. Recommendation:           - Patient has a contact number available for                            emergencies. The signs and symptoms of potential                            delayed complications were discussed with the                            patient. Return to normal activities tomorrow.                            Written discharge instructions were provided to the                             patient.                           - Resume previous diet.                           - Follow antireflux measures.                           - Continue present medications.                           - Dicyclomine 10 mg po qid prn abdominal pain,                            bloating, #120, 1 year of refills                           -  Await pathology results. Ladene Artist, MD 04/01/2021 3:19:22 PM This report has been signed electronically.

## 2021-04-01 NOTE — Op Note (Signed)
[place] Patient Name: Calvin Clay Procedure Date: 04/01/2021 2:39 PM MRN: [id number] Endoscopist: Ladene Artist , MD Age: 50 Referring MD:  Date of Birth: 12/09/1970 Gender: Male Account #: [id number] Procedure:                Colonoscopy Indications:              Screening in patient at increased risk: Family                            history of 1st-degree relative with colorectal                            cancer Medicines:                Monitored Anesthesia Care Procedure:                Pre-Anesthesia Assessment:                           - Prior to the procedure, a History and Physical                            was performed, and patient medications and                            allergies were reviewed. The patient's tolerance of                            previous anesthesia was also reviewed. The risks                            and benefits of the procedure and the sedation                            options and risks were discussed with the patient.                            All questions were answered, and informed consent                            was obtained. Prior Anticoagulants: The patient has                            taken no previous anticoagulant or antiplatelet                            agents. ASA Grade Assessment: III - A patient with                            severe systemic disease. After reviewing the risks                            and benefits, the patient was deemed in  satisfactory condition to undergo the procedure.                           After obtaining informed consent, the colonoscope                            was passed under direct vision. Throughout the                            procedure, the patient's blood pressure, pulse, and                            oxygen saturations were monitored continuously. The                            Olympus CF-HQ190L [id number]) Colonoscope was                             introduced through the anus and advanced to the the                            cecum, identified by appendiceal orifice and                            ileocecal valve. The ileocecal valve, appendiceal                            orifice, and rectum were photographed. The quality                            of the bowel preparation was good. The colonoscopy                            was performed without difficulty. The patient                            tolerated the procedure well. Scope In: 2:45:05 PM Scope Out: 3:03:08 PM Scope Withdrawal Time: 0 hours 13 minutes 48 seconds  Total Procedure Duration: 0 hours 18 minutes 3 seconds  Findings:                 The perianal and digital rectal examinations were                            normal.                           Two sessile polyps were found in the transverse                            colon and ileocecal valve. The polyps were 5 to 8                            mm in size. These polyps were removed with a cold  snare. Resection and retrieval were complete.                           A 3 mm polyp was found in the descending colon. The                            polyp was sessile. The polyp was removed with a                            cold biopsy forceps. Resection and retrieval were                            complete.                           Multiple medium-mouthed diverticula were found in                            the entire colon. There was no evidence of                            diverticular bleeding.                           Internal hemorrhoids were found during                            retroflexion. The hemorrhoids were small and Grade                            I (internal hemorrhoids that do not prolapse).                           The exam was otherwise without abnormality on                            direct and retroflexion views. Complications:            No immediate  complications. Estimated blood loss:                            None. Estimated Blood Loss:     Estimated blood loss: none. Impression:               - Two 5 to 8 mm polyps in the transverse colon and                            at the ileocecal valve, removed with a cold snare.                            Resected and retrieved.                           - One 3 mm polyp in the descending colon, removed  with a cold biopsy forceps. Resected and retrieved.                           - Moderate diverticulosis in the entire examined                            colon.                           - Internal hemorrhoids.                           - The examination was otherwise normal on direct                            and retroflexion views. Recommendation:           - Repeat colonoscopy after studies are complete for                            surveillance based on pathology results.                           - Patient has a contact number available for                            emergencies. The signs and symptoms of potential                            delayed complications were discussed with the                            patient. Return to normal activities tomorrow.                            Written discharge instructions were provided to the                            patient.                           - High fiber diet.                           - Continue present medications.                           - Await pathology results. Ladene Artist, MD 04/01/2021 3:15:28 PM This report has been signed electronically.

## 2021-04-21 ENCOUNTER — Encounter: Payer: Self-pay | Admitting: Gastroenterology

## 2021-06-10 IMAGING — US US ABDOMEN LIMITED RUQ/ASCITES
1 series · 14 of 25 positions shown · non-contrast
Comparison: CT renal 01/14/2020.

CLINICAL DATA: Right upper quadrant pain.

EXAM:
ULTRASOUND ABDOMEN LIMITED RIGHT UPPER QUADRANT

[Series 1: us abdomen limited ruq (liver/gb) · 14 of 27 slices shown]
[im 1/27]
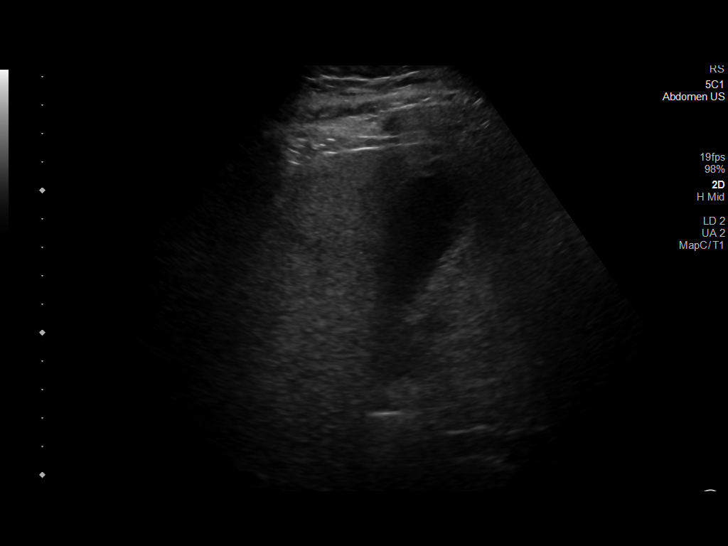
[im 3/27]
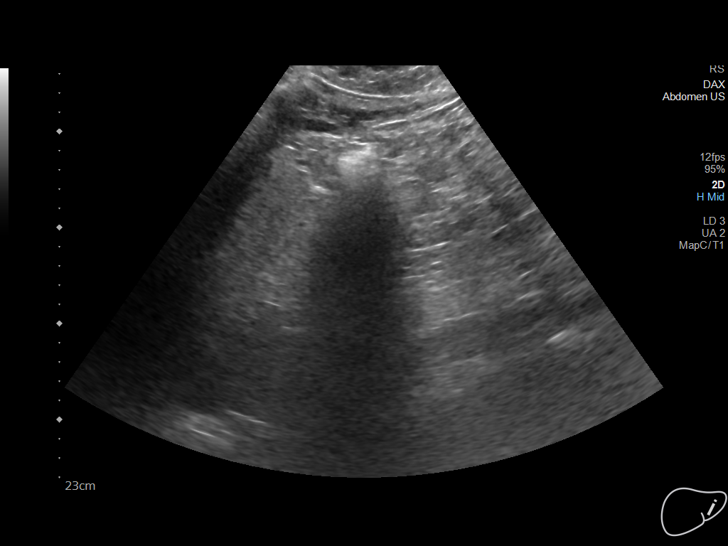
[im 5/27]
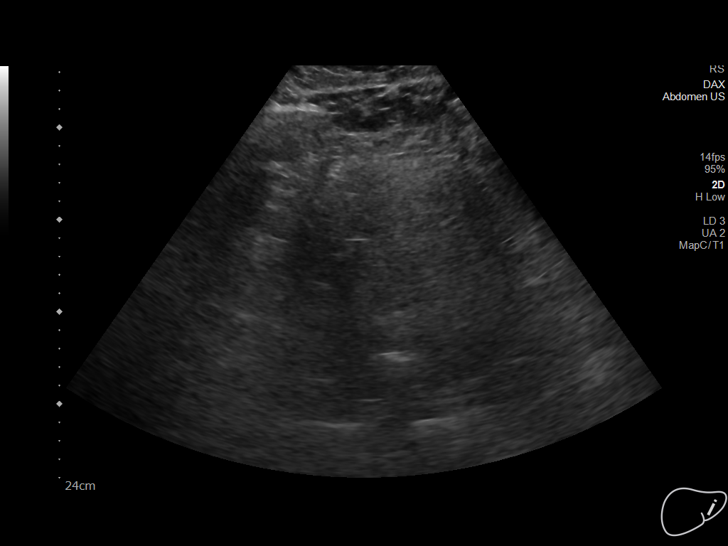
[im 7/27]
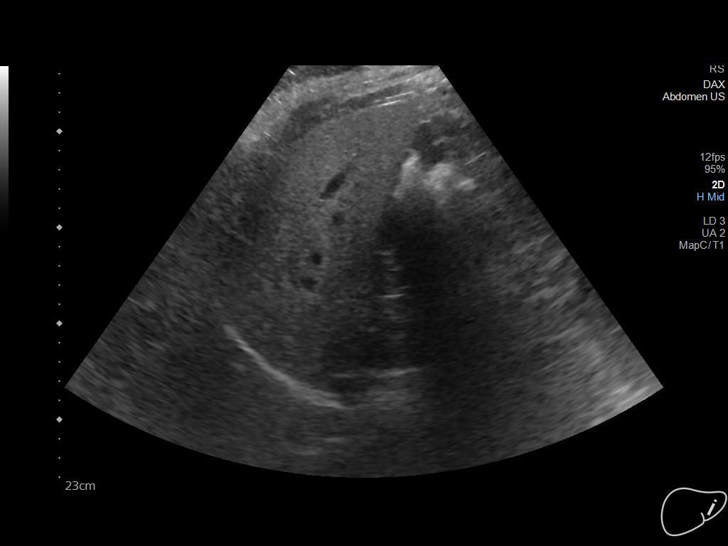
[im 9/27]
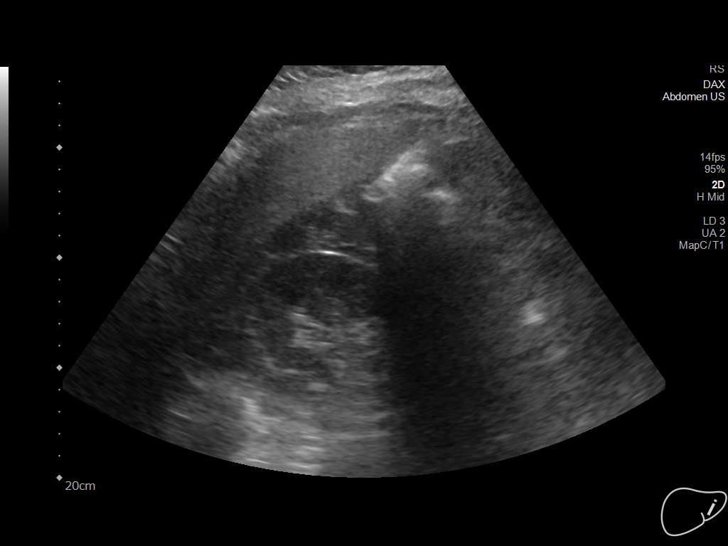
[im 10/27]
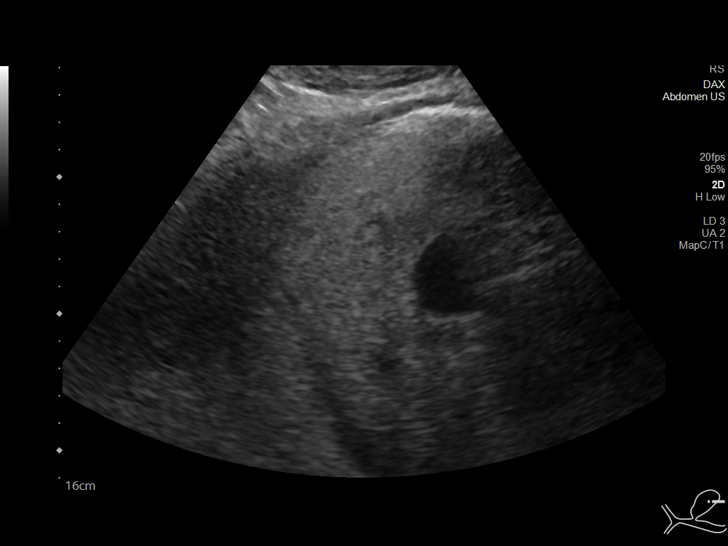
[im 12/27]
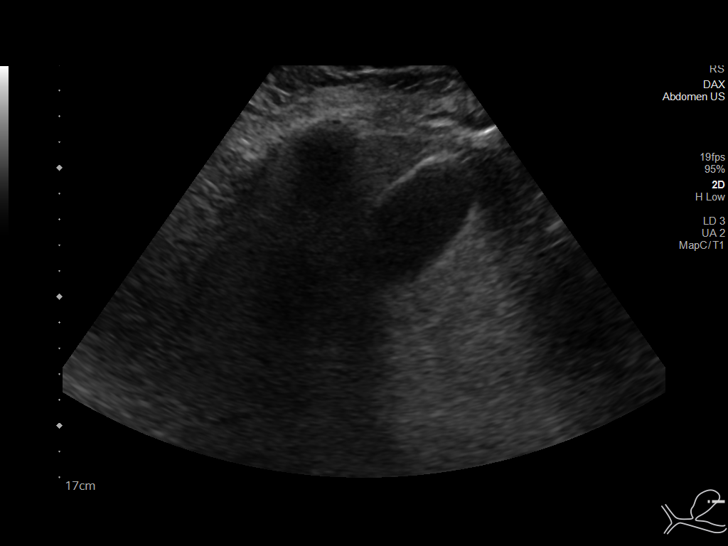
[im 15/27]
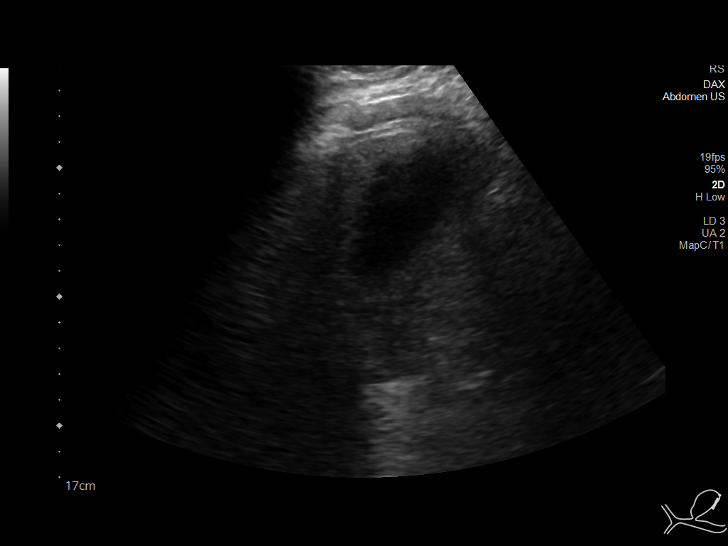
[im 17/27]
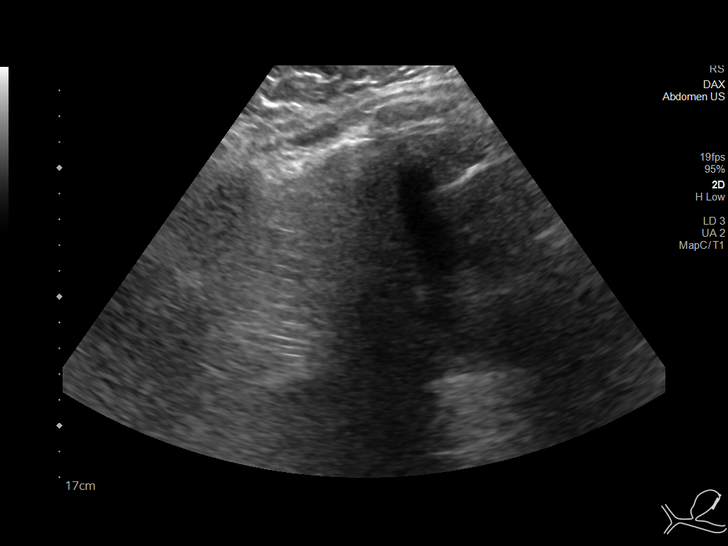
[im 18/27]
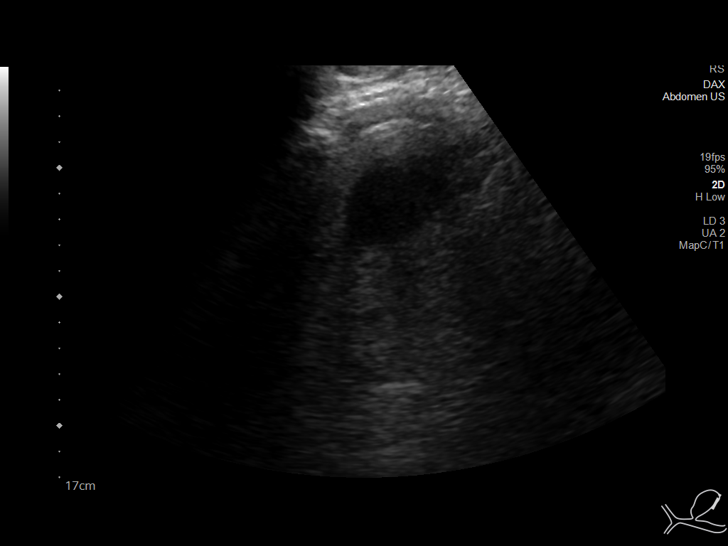
[im 20/27]
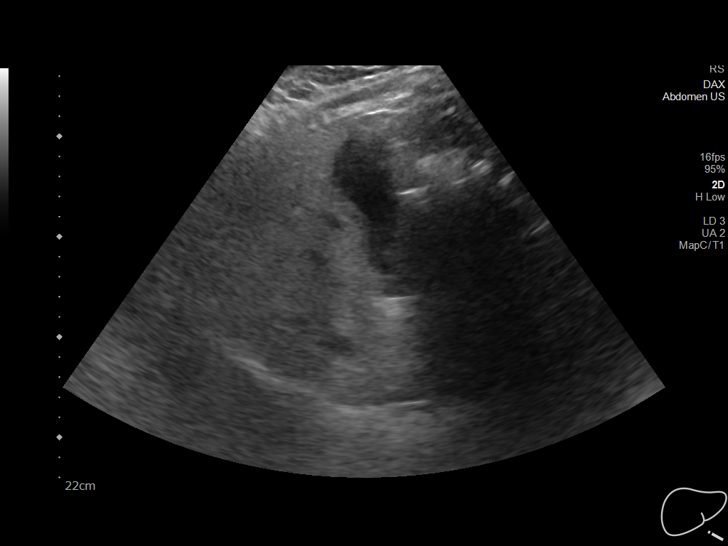
[im 22/27]
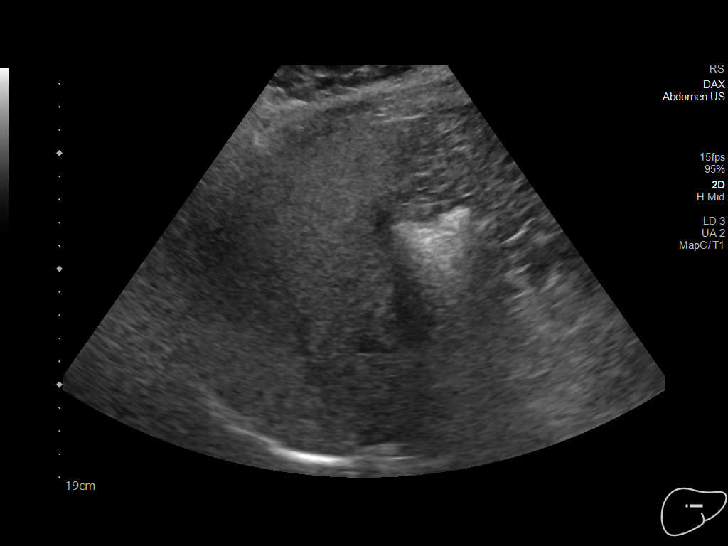
[im 24/27]
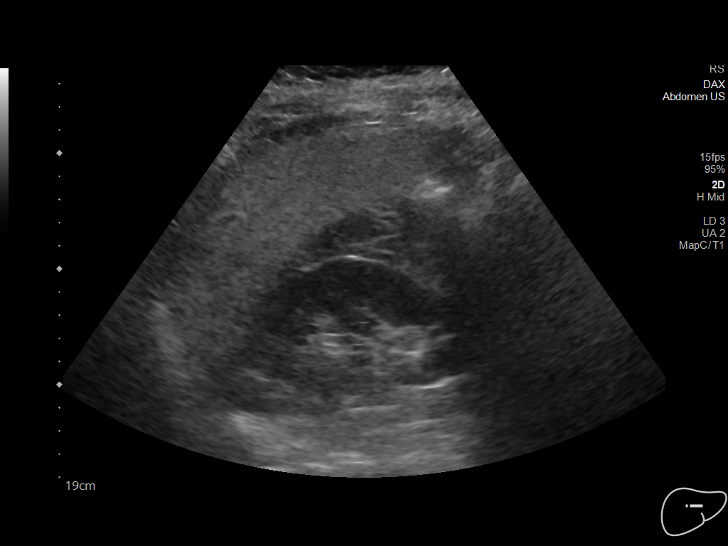
[im 27/27]
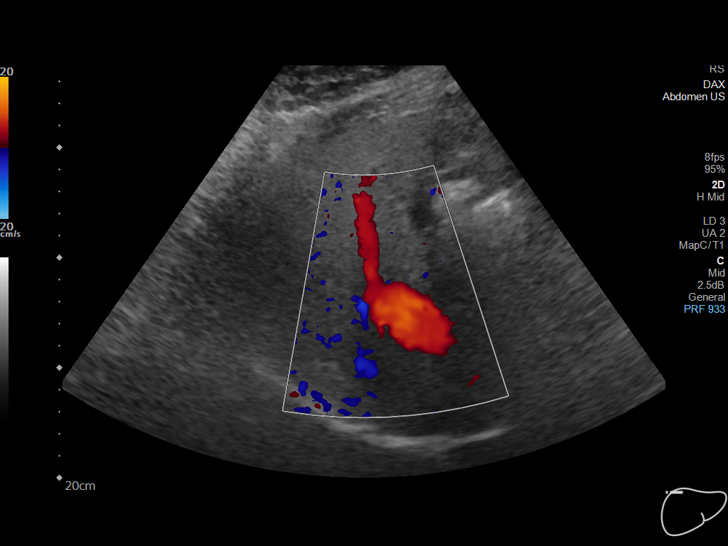

[14 of 25 positions shown; findings below may reference images not displayed]

FINDINGS: Gallbladder:

No gallstones or wall thickening visualized. No sonographic Murphy
sign noted by sonographer.

Common bile duct:

Not visualized.

Liver:

Limited visualization of the left hepatic lobe. No focal lesion
identified. Increased in parenchymal echogenicity. Portal vein is
patent on color Doppler imaging with normal direction of blood flow
towards the liver.

Other: None.
IMPRESSION: 1. Markedly limited evaluation with nonvisualization of the common
bile duct and poor visualization of the left hepatic lobe.
2. Hepatic steatosis. Please note limited evaluation for focal
hepatic masses in a patient with hepatic steatosis due to decreased
penetration of the acoustic ultrasound waves. Recommend MRI liver
protocol for further evaluation.

## 2022-11-01 ENCOUNTER — Institutional Professional Consult (permissible substitution): Payer: BC Managed Care – PPO | Admitting: Pulmonary Disease

## 2022-11-11 ENCOUNTER — Institutional Professional Consult (permissible substitution): Payer: BC Managed Care – PPO | Admitting: Pulmonary Disease

## 2022-12-17 ENCOUNTER — Institutional Professional Consult (permissible substitution): Payer: BC Managed Care – PPO | Admitting: Pulmonary Disease

## 2022-12-21 ENCOUNTER — Other Ambulatory Visit: Payer: Self-pay

## 2022-12-21 ENCOUNTER — Emergency Department (HOSPITAL_BASED_OUTPATIENT_CLINIC_OR_DEPARTMENT_OTHER)
Admission: EM | Admit: 2022-12-21 | Discharge: 2022-12-21 | Disposition: A | Discharge status: Home / Self Care | Payer: BC Managed Care – PPO | Attending: Emergency Medicine | Admitting: Emergency Medicine

## 2022-12-21 ENCOUNTER — Emergency Department (HOSPITAL_BASED_OUTPATIENT_CLINIC_OR_DEPARTMENT_OTHER): Payer: BC Managed Care – PPO

## 2022-12-21 ENCOUNTER — Encounter (HOSPITAL_BASED_OUTPATIENT_CLINIC_OR_DEPARTMENT_OTHER): Payer: Self-pay | Admitting: Emergency Medicine

## 2022-12-21 DIAGNOSIS — R072 Precordial pain: Secondary | ICD-10-CM | POA: Diagnosis present

## 2022-12-21 DIAGNOSIS — K219 Gastro-esophageal reflux disease without esophagitis: Secondary | ICD-10-CM | POA: Insufficient documentation

## 2022-12-21 LAB — BASIC METABOLIC PANEL
Anion gap: 8 (ref 5–15)
BUN: 11 mg/dL (ref 6–20)
CO2: 28 mmol/L (ref 22–32)
Calcium: 10.5 mg/dL — ABNORMAL HIGH (ref 8.9–10.3)
Chloride: 100 mmol/L (ref 98–111)
Creatinine, Ser: 1.15 mg/dL (ref 0.61–1.24)
GFR, Estimated: >60 mL/min (ref >60)
Glucose, Bld: 109 mg/dL — ABNORMAL HIGH (ref 70–99)
Potassium: 4 mmol/L (ref 3.5–5.1)
Sodium: 136 mmol/L (ref 135–145)

## 2022-12-21 LAB — CBC WITH DIFFERENTIAL/PLATELET
Abs Immature Granulocytes: 0.01 10*3/uL (ref 0.00–0.07)
Basophils Absolute: 0 10*3/uL (ref 0.0–0.1)
Basophils Relative: 0 %
Eosinophils Absolute: 0.2 10*3/uL (ref 0.0–0.5)
Eosinophils Relative: 4 %
HCT: 45.8 % (ref 39.0–52.0)
Hemoglobin: 14.9 g/dL (ref 13.0–17.0)
Immature Granulocytes: 0 %
Lymphocytes Relative: 29 %
Lymphs Abs: 1.9 10*3/uL (ref 0.7–4.0)
MCH: 27.4 pg (ref 26.0–34.0)
MCHC: 32.5 g/dL (ref 30.0–36.0)
MCV: 84.3 fL (ref 80.0–100.0)
Monocytes Absolute: 0.4 10*3/uL (ref 0.1–1.0)
Monocytes Relative: 5 %
Neutro Abs: 4.1 10*3/uL (ref 1.7–7.7)
Neutrophils Relative %: 62 %
Platelets: 297 10*3/uL (ref 150–400)
RBC: 5.43 MIL/uL (ref 4.22–5.81)
RDW: 14.5 % (ref 11.5–15.5)
WBC: 6.7 10*3/uL (ref 4.0–10.5)
nRBC: 0 % (ref 0.0–0.2)

## 2022-12-21 LAB — TROPONIN I (HIGH SENSITIVITY): Troponin I (High Sensitivity): 2 ng/L (ref <18)

## 2022-12-21 MED ORDER — OMEPRAZOLE 20 MG PO CPDR: 20.0000 mg | DELAYED_RELEASE_CAPSULE | Freq: Every day | ORAL | 0 refills | Status: AC

## 2022-12-21 MED ORDER — ALUM & MAG HYDROXIDE-SIMETH 200-200-20 MG/5ML PO SUSP
30.0000 mL | Freq: Once | ORAL | Status: AC
  Administered 2022-12-21: 30 mL via ORAL
  Filled 2022-12-21: qty 30

## 2022-12-21 MED ORDER — DICYCLOMINE HCL 10 MG PO CAPS
10.0000 mg | ORAL_CAPSULE | Freq: Once | ORAL | Status: AC
  Administered 2022-12-21: 10 mg via ORAL
  Filled 2022-12-21: qty 1

## 2022-12-21 NOTE — ED Triage Notes (Signed)
Pt c/o center chest pain intermittently since 1500 yesterday, hx of acid reflux, EDP at bedside

## 2022-12-21 NOTE — ED Notes (Signed)
Pt sts that pain improved until about 3 mins ago, sts now pain is more in stomach and not in chest and "feels like a rumbling". Denies needs at this time. NAD.

## 2022-12-21 NOTE — ED Provider Notes (Signed)
[facility] EMERGENCY DEPT Provider Note   CSN: [id number] Arrival date & time: 12/21/22  7829     History  Chief Complaint  Patient presents with   Chest Pain    Calvin Clay is a 52 y.o. male.  The history is provided by the patient.  Chest Pain Pain location:  Substernal area (burning into the throat) Pain quality: burning and tightness   Pain severity:  Moderate Onset quality:  Sudden Duration:  15 hours Timing:  Constant Progression:  Unchanged Chronicity:  New Context: at rest   Relieved by:  Nothing Worsened by:  Nothing Ineffective treatments:  Antacids (but was able to belch post medication) Associated symptoms: no fever, no lower extremity edema, no nausea, no palpitations, no PND, no shortness of breath and no vomiting   Risk factors: male sex   Patient with a h/o GERD off his medication who presents with SSCP and neck pain and belching x 15 hours.  No exertional symptoms.  No SOB.  No n/v/d.      Home Medications Prior to Admission medications   Medication Sig Start Date End Date Taking? Authorizing Provider  omeprazole (PRILOSEC) 20 MG capsule Take 1 capsule (20 mg total) by mouth daily. 12/21/22  Yes Brittyn Salaz, MD  acetaminophen (TYLENOL) 650 MG CR tablet Take 975 mg by mouth every 8 (eight) hours as needed for pain or fever.    [provider]  albuterol (PROVENTIL HFA;VENTOLIN HFA) 108 (90 Base) MCG/ACT inhaler Inhale 2 puffs into the lungs every 6 (six) hours as needed for wheezing or shortness of breath. 07/27/17   Elsie Stain, MD  budesonide-formoterol [facility]) 160-4.5 MCG/ACT inhaler Inhale 2 puffs into the lungs 2 (two) times daily. 07/27/17   Elsie Stain, MD  dicyclomine (BENTYL) 10 MG capsule Take 1 capsule (10 mg total) by mouth 4 (four) times daily as needed for spasms (abdominal pain , bloating). 04/01/21   Ladene Artist, MD  LORazepam (ATIVAN) 1 MG tablet Take 0.5-1 mg by mouth 2 (two) times daily as  needed. 01/11/21   [provider]  Multiple Vitamin (MULTIVITAMIN) tablet Take 2 tablets by mouth daily.    [provider]  omeprazole (PRILOSEC) 20 MG capsule Take 20 mg by mouth daily.    [provider]  traMADol-acetaminophen [facility]) 37.5-325 MG tablet Take by mouth. 01/26/21   [provider]      Allergies    Iodine and Shrimp [shellfish allergy]    Review of Systems   Review of Systems  Constitutional:  Negative for fever.  HENT:  Negative for facial swelling.   Respiratory:  Negative for shortness of breath.   Cardiovascular:  Positive for chest pain. Negative for palpitations, leg swelling and PND.  Gastrointestinal:  Negative for nausea and vomiting.  All other systems reviewed and are negative.   Physical Exam Updated Vital Signs BP 116/82   Pulse 92   Temp 97.9 F (36.6 C) (Oral)   Resp 19   Ht '6\' 3"'$  (1.905 m)   Wt 136.1 kg   SpO2 98%   BMI 37.50 kg/m  Physical Exam Vitals and nursing note reviewed.  Constitutional:      General: He is not in acute distress.    Appearance: He is well-developed. He is not diaphoretic.  HENT:     Head: Normocephalic and atraumatic.  Eyes:     Conjunctiva/sclera: Conjunctivae normal.     Pupils: Pupils are equal, round, and reactive to light.  Cardiovascular:     Rate and Rhythm: Normal rate and regular rhythm.  Pulmonary:     Effort: Pulmonary effort is normal.     Breath sounds: Normal breath sounds. No wheezing or rales.  Abdominal:     General: Bowel sounds are normal.     Palpations: Abdomen is soft.     Tenderness: There is no abdominal tenderness. There is no guarding or rebound.  Musculoskeletal:        General: No tenderness. Normal range of motion.     Cervical back: Normal range of motion and neck supple.     Right lower leg: No edema.     Left lower leg: No edema.  Skin:    General: Skin is warm and dry.     Capillary Refill: Capillary refill takes less than 2 seconds.   Neurological:     General: No focal deficit present.     Mental Status: He is alert and oriented to person, place, and time.     ED Results / Procedures / Treatments   Labs (all labs ordered are listed, but only abnormal results are displayed) Results for orders placed or performed during the hospital encounter of 12/21/22  CBC with Differential  Result Value Ref Range   WBC 6.7 4.0 - 10.5 K/uL   RBC 5.43 4.22 - 5.81 MIL/uL   Hemoglobin 14.9 13.0 - 17.0 g/dL   HCT 45.8 39.0 - 52.0 %   MCV 84.3 80.0 - 100.0 fL   MCH 27.4 26.0 - 34.0 pg   MCHC 32.5 30.0 - 36.0 g/dL   RDW 14.5 11.5 - 15.5 %   Platelets 297 150 - 400 K/uL   nRBC 0.0 0.0 - 0.2 %   Neutrophils Relative % 62 %   Neutro Abs 4.1 1.7 - 7.7 K/uL   Lymphocytes Relative 29 %   Lymphs Abs 1.9 0.7 - 4.0 K/uL   Monocytes Relative 5 %   Monocytes Absolute 0.4 0.1 - 1.0 K/uL   Eosinophils Relative 4 %   Eosinophils Absolute 0.2 0.0 - 0.5 K/uL   Basophils Relative 0 %   Basophils Absolute 0.0 0.0 - 0.1 K/uL   Immature Granulocytes 0 %   Abs Immature Granulocytes 0.01 0.00 - 0.07 K/uL  Basic metabolic panel  Result Value Ref Range   Sodium 136 135 - 145 mmol/L   Potassium 4.0 3.5 - 5.1 mmol/L   Chloride 100 98 - 111 mmol/L   CO2 28 22 - 32 mmol/L   Glucose, Bld 109 (H) 70 - 99 mg/dL   BUN 11 6 - 20 mg/dL   Creatinine, Ser 1.15 0.61 - 1.24 mg/dL   Calcium 10.5 (H) 8.9 - 10.3 mg/dL   GFR, Estimated >60 >60 mL/min   Anion gap 8 5 - 15  Troponin I (High Sensitivity)  Result Value Ref Range   Troponin I (High Sensitivity) 2 <18 ng/L   DG Chest Portable 1 View  Result Date: 12/21/2022 CLINICAL DATA:  Chest pain EXAM: PORTABLE CHEST 1 VIEW COMPARISON:  CT 04/10/2020 FINDINGS: Heart size and mediastinal contours are normal. Low lung volumes. No pleural effusion or edema. No airspace opacities identified. IMPRESSION: Low lung volumes. No acute findings. Electronically Signed   By: Kerby Moors M.D.   On: 12/21/2022 05:50     EKG EKG Interpretation  Date/Time:  Tuesday December 21 2022 05:26:25 EST Ventricular Rate:  94 PR Interval:  199 QRS Duration: 83 QT Interval:  347 QTC Calculation: 434 R  Axis:   34 Text Interpretation: Sinus rhythm Confirmed by Randal Buba, Abcde Oneil ([id number]) on 12/21/2022 6:16:29 AM  Radiology DG Chest Portable 1 View  Result Date: 12/21/2022 CLINICAL DATA:  Chest pain EXAM: PORTABLE CHEST 1 VIEW COMPARISON:  CT 04/10/2020 FINDINGS: Heart size and mediastinal contours are normal. Low lung volumes. No pleural effusion or edema. No airspace opacities identified. IMPRESSION: Low lung volumes. No acute findings. Electronically Signed   By: Kerby Moors M.D.   On: 12/21/2022 05:50    Procedures Procedures    Medications Ordered in ED Medications  alum & mag hydroxide-simeth (MAALOX/MYLANTA) 200-200-20 MG/5ML suspension 30 mL (30 mLs Oral Given 12/21/22 0543)  dicyclomine (BENTYL) capsule 10 mg (10 mg Oral Given 12/21/22 0544)    ED Course/ Medical Decision Making/ A&P                             Medical Decision Making Patient with chest and throat pain x 15 hours   Problems Addressed: Gastroesophageal reflux disease, unspecified whether esophagitis present:    Details: Prilosec gerd friendly diet and follow up with GI  Amount and/or Complexity of Data Reviewed External Data Reviewed: notes.    Details: Previous notes reviewed  Labs: ordered.    Details: All labs reviewed: troponin is normal 2, normal white count 6.7, normal hemoglobin 14.9, normal platelet count.  Normal sodium 136, normal potassium and creatinine 1.15 Radiology: ordered and independent interpretation performed.    Details: Negative cxr by me  ECG/medicine tests: ordered and independent interpretation performed. Decision-making details documented in ED Course.  Risk OTC drugs. Prescription drug management. Risk Details: Exam and vitals are benign and reassuring.  Ruled out for MI based on time course with 1  negative EKG and troponin.  PERC negative wells 0 highly doubt PE in this low risk patient.  Symptoms and exam are consistent with GERD.  Symptoms improved with GI cocktail.  GERD friendly diet instructions given.  Will start PPI and refer to GI.  Stable for discharge with close follow up.  Strict return.     Final Clinical Impression(s) / ED Diagnoses Final diagnoses:  Gastroesophageal reflux disease, unspecified whether esophagitis present   Return for intractable cough, coughing up blood, fevers > 100.4 unrelieved by medication, shortness of breath, intractable vomiting, chest pain, shortness of breath, weakness, numbness, changes in speech, facial asymmetry, abdominal pain, passing out, Inability to tolerate liquids or food, cough, altered mental status or any concerns. No signs of systemic illness or infection. The patient is nontoxic-appearing on exam and vital signs are within normal limits.  I have reviewed the triage vital signs and the nursing notes. Pertinent labs & imaging results that were available during my care of the patient were reviewed by me and considered in my medical decision making (see chart for details). After history, exam, and medical workup I feel the patient has been appropriately medically screened and is safe for discharge home. Pertinent diagnoses were discussed with the patient. Patient was given return precautions.  Rx / DC Orders ED Discharge Orders          Ordered    omeprazole (PRILOSEC) 20 MG capsule  Daily        12/21/22 0626              Nakiea Metzner, MD 12/21/22 [phone]

## 2023-09-27 ENCOUNTER — Emergency Department (HOSPITAL_COMMUNITY)
Admission: EM | Admit: 2023-09-27 | Discharge: 2023-09-27 | Disposition: A | Discharge status: Home / Self Care | Payer: BC Managed Care – PPO | Attending: Emergency Medicine | Admitting: Emergency Medicine

## 2023-09-27 ENCOUNTER — Other Ambulatory Visit: Payer: Self-pay

## 2023-09-27 ENCOUNTER — Encounter (HOSPITAL_COMMUNITY): Payer: Self-pay

## 2023-09-27 ENCOUNTER — Emergency Department (HOSPITAL_COMMUNITY): Payer: BC Managed Care – PPO

## 2023-09-27 DIAGNOSIS — R079 Chest pain, unspecified: Secondary | ICD-10-CM | POA: Insufficient documentation

## 2023-09-27 DIAGNOSIS — Z7951 Long term (current) use of inhaled steroids: Secondary | ICD-10-CM | POA: Insufficient documentation

## 2023-09-27 DIAGNOSIS — R002 Palpitations: Secondary | ICD-10-CM | POA: Diagnosis not present

## 2023-09-27 DIAGNOSIS — Z8616 Personal history of COVID-19: Secondary | ICD-10-CM | POA: Insufficient documentation

## 2023-09-27 DIAGNOSIS — I10 Essential (primary) hypertension: Secondary | ICD-10-CM | POA: Diagnosis not present

## 2023-09-27 DIAGNOSIS — Z79899 Other long term (current) drug therapy: Secondary | ICD-10-CM | POA: Diagnosis not present

## 2023-09-27 DIAGNOSIS — R000 Tachycardia, unspecified: Secondary | ICD-10-CM | POA: Diagnosis not present

## 2023-09-27 DIAGNOSIS — J45909 Unspecified asthma, uncomplicated: Secondary | ICD-10-CM | POA: Diagnosis not present

## 2023-09-27 LAB — CBC
HCT: 46.8 % (ref 39.0–52.0)
Hemoglobin: 14.9 g/dL (ref 13.0–17.0)
MCH: 26.9 pg (ref 26.0–34.0)
MCHC: 31.8 g/dL (ref 30.0–36.0)
MCV: 84.6 fL (ref 80.0–100.0)
Platelets: 292 10*3/uL (ref 150–400)
RBC: 5.53 MIL/uL (ref 4.22–5.81)
RDW: 14.3 % (ref 11.5–15.5)
WBC: 8.8 10*3/uL (ref 4.0–10.5)
nRBC: 0 % (ref 0.0–0.2)

## 2023-09-27 LAB — TROPONIN I (HIGH SENSITIVITY)
Troponin I (High Sensitivity): 4 ng/L (ref <18)
Troponin I (High Sensitivity): 7 ng/L (ref <18)

## 2023-09-27 LAB — BASIC METABOLIC PANEL
Anion gap: 10 (ref 5–15)
BUN: 12 mg/dL (ref 6–20)
CO2: 24 mmol/L (ref 22–32)
Calcium: 9.7 mg/dL (ref 8.9–10.3)
Chloride: 102 mmol/L (ref 98–111)
Creatinine, Ser: 0.97 mg/dL (ref 0.61–1.24)
GFR, Estimated: >60 mL/min (ref >60)
Glucose, Bld: 80 mg/dL (ref 70–99)
Potassium: 3.6 mmol/L (ref 3.5–5.1)
Sodium: 136 mmol/L (ref 135–145)

## 2023-09-27 LAB — D-DIMER, QUANTITATIVE: D-Dimer, Quant: 0.38 ug{FEU}/mL (ref 0.00–0.50)

## 2023-09-27 LAB — CK: Total CK: 435 U/L — ABNORMAL HIGH (ref 49–397)

## 2023-09-27 MED ORDER — METOPROLOL SUCCINATE ER 25 MG PO TB24: 25.0000 mg | ORAL_TABLET | Freq: Every day | ORAL | 0 refills | Status: AC

## 2023-09-27 NOTE — ED Triage Notes (Addendum)
Patient arrives for eval of fifteen minutes of chest pain and palpitations. Reports symptoms have been fluctuating since onset. Recently taken of Metoprolol and put on Nifedipine and just started taking rosouvastatin.

## 2023-09-27 NOTE — ED Provider Notes (Signed)
[place] [facility]ERG[facility] DEPARTMENT AT [facility] Provider Note  CSN: [id number] Arrival date & time: 09/27/23 0865  Chief Complaint(s) Chest Pain and Palpitations  HPI Calvin Clay is a 52 y.o. male history of hypertension, prior pulmonary embolism in setting of COVID not currently on anticoagulation presenting to the emergency department with chest pain and palpitations.  He reports after his pulmonary embolism he was started on metoprolol for some palpitations and fast heart rate.  Reports that he has been on this medication for some time.  He is concerned it was causing fatigue so he followed up with his primary doctor and they switched him to nifedipine.  Reports since then he has had chest pain, palpitations.  Chest pain is not pleuritic or exertional.  Does not feel short of breath.  No nausea or vomiting.  No numbness, tingling, weakness.  No fevers or chills.  No abdominal pain.  Chest pain waxes and wanes.  He was also started on a statin medicine.  Reports that he had an episode of leg cramps but this is resolved.  Denies any recent travel or surgery, leg swelling.   Past Medical History Past Medical History:  Diagnosis Date   Allergy    Anxiety    Asthma    COVID-19    GERD (gastroesophageal reflux disease)    Hyperlipidemia    Hypertension    only took medications when he had Covid-19   Migraine    Patient Active Problem List   Diagnosis Date Noted   Chest tightness 03/12/2020   History of COVID-19 03/12/2020   History of pulmonary embolus (PE) 03/12/2020   Palpitations 03/11/2020   Acute respiratory failure with hypoxia (HCC)    Acute pulmonary embolism (HCC)    AKI (acute kidney injury) (HCC)    Chest pain, rule out acute myocardial infarction 08/23/2017   Tremor 07/27/2017   Severe persistent asthma without complication 07/27/2017   Asthma exacerbation 06/30/2017   Migraine 09/16/2012   Home Medication(s) Prior to Admission medications   Medication  Sig Start Date End Date Taking? Authorizing Provider  rosuvastatin (CRESTOR) 40 MG tablet Take by mouth. 09/22/23  Yes [provider]  acetaminophen (TYLENOL) 650 MG CR tablet Take 975 mg by mouth every 8 (eight) hours as needed for pain or fever.    [provider]  albuterol (PROVENTIL HFA;VENTOLIN HFA) 108 (90 Base) MCG/ACT inhaler Inhale 2 puffs into the lungs every 6 (six) hours as needed for wheezing or shortness of breath. 07/27/17   Storm Frisk, MD  budesonide-formoterol [facility]) 160-4.5 MCG/ACT inhaler Inhale 2 puffs into the lungs 2 (two) times daily. 07/27/17   Storm Frisk, MD  dicyclomine (BENTYL) 10 MG capsule Take 1 capsule (10 mg total) by mouth 4 (four) times daily as needed for spasms (abdominal pain , bloating). 04/01/21   Meryl Dare, MD  LORazepam (ATIVAN) 1 MG tablet Take 0.5-1 mg by mouth 2 (two) times daily as needed. 01/11/21   [provider]  metoprolol succinate (TOPROL-XL) 25 MG 24 hr tablet Take 1 tablet (25 mg total) by mouth daily. 09/27/23   Lonell Grandchild, MD  Multiple Vitamin (MULTIVITAMIN) tablet Take 2 tablets by mouth daily.    [provider]  omeprazole (PRILOSEC) 20 MG capsule Take 20 mg by mouth daily.    [provider]  omeprazole (PRILOSEC) 20 MG capsule Take 1 capsule (20 mg total) by mouth daily. 12/21/22   Palumbo, April, MD  traMADol-acetaminophen [facility]) 37.5-325  MG tablet Take by mouth. 01/26/21   [provider]                                                                                                                                    Past Surgical History Past Surgical History:  Procedure Laterality Date   COLONOSCOPY     COSMETIC SURGERY     ESOPHAGOGASTRODUODENOSCOPY  09/16/2012   Procedure: ESOPHAGOGASTRODUODENOSCOPY (EGD);  Surgeon: Meryl Dare, MD,FACG;  Location: [facility] ENDOSCOPY;  Service: Endoscopy;  Laterality: N/A;   TENDON REPAIR Right    elbow    Family History Family History  Problem Relation Age of Onset   Asthma Mother    Ovarian cancer Mother    Diverticulitis Mother    Colon cancer Father    Multiple sclerosis Father    Asthma Sister    Asthma Brother    Esophageal cancer Neg Hx    Stomach cancer Neg Hx    Rectal cancer Neg Hx     Social History Social History   Tobacco Use   Smoking status: Never   Smokeless tobacco: Never  Vaping Use   Vaping status: Never Used  Substance Use Topics   Alcohol use: Yes    Comment: oaccionally once a week   Drug use: No   Allergies Iodine and Shrimp [shellfish allergy]  Review of Systems Review of Systems  All other systems reviewed and are negative.   Physical Exam Vital Signs  I have reviewed the triage vital signs BP 117/81   Pulse 92   Temp 98 F (36.7 C) (Oral)   Resp 12   SpO2 98%  Physical Exam Vitals and nursing note reviewed.  Constitutional:      General: He is not in acute distress.    Appearance: Normal appearance.  HENT:     Mouth/Throat:     Mouth: Mucous membranes are moist.  Eyes:     Conjunctiva/sclera: Conjunctivae normal.  Cardiovascular:     Rate and Rhythm: Regular rhythm. Tachycardia present.  Pulmonary:     Effort: Pulmonary effort is normal. No respiratory distress.     Breath sounds: Normal breath sounds.  Abdominal:     General: Abdomen is flat.     Palpations: Abdomen is soft.     Tenderness: There is no abdominal tenderness.  Musculoskeletal:     Right lower leg: No edema.     Left lower leg: No edema.  Skin:    General: Skin is warm and dry.     Capillary Refill: Capillary refill takes less than 2 seconds.  Neurological:     Mental Status: He is alert and oriented to person, place, and time. Mental status is at baseline.  Psychiatric:        Mood and Affect: Mood normal.        Behavior: Behavior normal.     ED Results and Treatments Labs (all labs  ordered are listed, but only abnormal results are  displayed) Labs Reviewed  CK - Abnormal; Notable for the following components:      Result Value   Total CK 435 (*)    All other components within normal limits  BASIC METABOLIC PANEL  CBC  D-DIMER, QUANTITATIVE  TROPONIN I (HIGH SENSITIVITY)  TROPONIN I (HIGH SENSITIVITY)                                                                                                                          Radiology DG Chest 2 View  Result Date: 09/27/2023 CLINICAL DATA:  Chest pain. EXAM: CHEST - 2 VIEW COMPARISON:  Chest radiograph dated December 21, 2022. FINDINGS: The heart size and mediastinal contours are within normal limits. Both lungs are clear. No pneumothorax or pleural effusion. No acute osseous abnormality. IMPRESSION: No acute cardiopulmonary findings. Electronically Signed   By: Hart Robinsons M.D.   On: 09/27/2023 11:54    Pertinent labs & imaging results that were available during my care of the patient were reviewed by me and considered in my medical decision making (see MDM for details).  Medications Ordered in ED Medications - No data to display                                                                                                                                   Procedures Procedures  (including critical care time)  Medical Decision Making / ED Course   MDM:  52 year old presenting to the emergency department with chest pain and palpitations.  Patient overall well-appearing, vitals with tachycardia.  EKG demonstrates sinus tachycardia.  Physical exam otherwise unremarkable.  Differential includes effect of discontinuation of metoprolol, he reports that he was put on metoprolol previously for similar symptoms a few years ago so suspect this is most likely cause, differential also includes ACS, pulmonary embolism, pneumothorax, GERD, pneumonia.  Doubt dissection given multiple days of symptoms that come and go, chest x-ray with stable mediastinal silhouette.  Will  check D-dimer given history of PE symptoms seem atypical for pulmonary embolism.  Given episode of leg cramping will check CK but doubt rhabdomyolysis.  Initial troponin negative.  EKG with no acute ST or T wave changes.  Chest x-ray with no signs of pneumonia or pneumothorax.  Will reassess.  Likely restart metoprolol and refer to cardiology if remainder of testing is reassuring.  Clinical  Course as of 09/27/23 1226  Tue Sep 27, 2023  1225 Heart rate improved without intervention.  D-dimer is negative.  Troponin negative x 2.  CK borderline elevated but not at any level I would suspect rhabdomyolysis or complication from statin.  He is not having any current cramping or muscular pain, myalgias.  Given that his symptoms all began after he stopped metoprolol, suspect some component of this could be related to medication change.  Will represcribe metoprolol.  Advise follow-up with cardiology given chest pain and placed urgent referral.  Will discharge patient to home. All questions answered. Patient comfortable with plan of discharge. Return precautions discussed with patient and specified on the after visit summary.  [WS]    Clinical Course User Index [WS] Lonell Grandchild, MD     Additional history obtained: -External records from outside source obtained and reviewed including: Chart review including previous notes, labs, imaging, consultation notes including ETT in 2021 was normal   Lab Tests: -I ordered, reviewed, and interpreted labs.   The pertinent results include:   Labs Reviewed  CK - Abnormal; Notable for the following components:      Result Value   Total CK 435 (*)    All other components within normal limits  BASIC METABOLIC PANEL  CBC  D-DIMER, QUANTITATIVE  TROPONIN I (HIGH SENSITIVITY)  TROPONIN I (HIGH SENSITIVITY)    Notable for borderline CK elevation  EKG   EKG Interpretation Date/Time:  Tuesday September 27 2023 10:51:14 EDT Ventricular Rate:  102 PR  Interval:  178 QRS Duration:  91 QT Interval:  352 QTC Calculation: 459 R Axis:   51  Text Interpretation: Sinus tachycardia Confirmed by Alvino Blood ([id number]) on 09/27/2023 11:01:27 AM         Imaging Studies ordered: I ordered imaging studies including CXR On my interpretation imaging demonstrates no acute process I independently visualized and interpreted imaging. I agree with the radiologist interpretation   Medicines ordered and prescription drug management: Meds ordered this encounter  Medications   metoprolol succinate (TOPROL-XL) 25 MG 24 hr tablet    Sig: Take 1 tablet (25 mg total) by mouth daily.    Dispense:  90 tablet    Refill:  0    -I have reviewed the patients home medicines and have made adjustments as needed  Social Determinants of Health:  Diagnosis or treatment significantly limited by social determinants of health: obesity   Co morbidities that complicate the patient evaluation  Past Medical History:  Diagnosis Date   Allergy    Anxiety    Asthma    COVID-19    GERD (gastroesophageal reflux disease)    Hyperlipidemia    Hypertension    only took medications when he had Covid-19   Migraine       Dispostion: Disposition decision including need for hospitalization was considered, and patient discharged from emergency department.    Final Clinical Impression(s) / ED Diagnoses Final diagnoses:  Palpitations  Chest pain, unspecified type     This chart was dictated using voice recognition software.  Despite best efforts to proofread,  errors can occur which can change the documentation meaning.    Lonell Grandchild, MD 09/27/23 1226

## 2023-09-27 NOTE — Discharge Instructions (Signed)
We evaluated you for your palpitations and chest pain.  The most likely cause of this is your recent medication change from metoprolol to nifedipine.  I have prescribed you metoprolol.  Please try this instead of nifedipine and follow-up with your primary doctor.  Your EKG did not show dangerous heart rhythm, and your laboratory testing did not show any signs of a heart attack, but I would like you to follow-up with a cardiologist.  I placed a referral for cardiology follow-up.  Please call them if you do not hear back in the next 72 hours.  If you do develop any new or worsening symptoms such as difficulty breathing, fainting, lightheadedness or dizziness, worsening pain, vomiting, back pain, or any other new symptoms, please return to the emergency department for reassessment.

## 2023-11-22 NOTE — Progress Notes (Signed)
Cardiology Office Note:  .   Date:  11/25/2023  ID:  Calvin Clay, DOB Oct 06, 1971, MRN [id number] PCP: Mike Craze  [facility] Providers Cardiologist:  None { History of Present Illness: Calvin Clay   Calvin Clay is a 52 y.o. male with history of hypertension, PE in the setting of COVID-19 infection, asthma who presents for the evaluation of chest pain at the request of Clay, Scott, PA-C.  Seen in the emergency room and October 2024 with complaints of palpitations and chest pain.  Workup was negative including high-sensitivity troponin negative x 2.  CT Chest 04/10/2020 -> No coronary calcium Stress Echo 03/2020-> Normal   History of Present Illness   Calvin Clay, a 52 year old with a history of hypertension, PE, PACs, and asthma, presents for evaluation of chest pain. The pain, described as a gas-like sensation, is located centrally in the chest and radiates to the back and right shoulder blade. The pain has been occurring for the past year, with no identifiable triggers. The patient reports that the pain would persist for several days, causing significant discomfort. The patient has made dietary modifications, including cutting out coffee and modifying his diet, which have led to a significant improvement in symptoms. The patient also takes metoprolol for arrhythmia and omeprazole for what appears to be acid reflux. The patient reports that he works two full-time jobs, leading to significant stress and lack of sleep. He also reports being morbidly obese. Likely has OSA. No family history of heart disease. No tobacco, etoh, or drug use. Married with 2 children.          Problem List HTN PE (Covid) Asthma PACs  Obesity  -BMI 41    ROS: All other ROS reviewed and negative. Pertinent positives noted in the HPI.     Studies Reviewed: Calvin Clay   EKG Interpretation Date/Time:  Friday November 25 2023 15:10:49 EST Ventricular Rate:  90 PR Interval:  182 QRS Duration:  80 QT  Interval:  372 QTC Calculation: 455 R Axis:   41  Text Interpretation: Normal sinus rhythm Normal ECG Confirmed by Lennie Odor [phone]) on 11/25/2023 3:13:00 PM   Physical Exam:   VS:  BP 132/86   Pulse 95   Ht 6\' 3"  (1.905 m)   Wt (!) 332 lb (150.6 kg)   SpO2 99%   BMI 41.50 kg/m    Wt Readings from Last 3 Encounters:  11/25/23 (!) 332 lb (150.6 kg)  12/21/22 300 lb (136.1 kg)  04/01/21 (!) 329 lb (149.2 kg)    GEN: Well nourished, well developed in no acute distress NECK: No JVD; No carotid bruits CARDIAC: RRR, no murmurs, rubs, gallops RESPIRATORY:  Clear to auscultation without rales, wheezing or rhonchi  ABDOMEN: Soft, non-tender, non-distended EXTREMITIES:  No edema; No deformity  ASSESSMENT AND PLAN: .   Assessment and Plan    Chest Pain Likely secondary to acid reflux. Symptoms have improved with dietary modifications. Patient has a history of hypertension and morbid obesity, increasing cardiovascular risk. Despite improvement in symptoms, patient has expressed concern about heart disease. Echo and stress test normal in 2021.  -Order Coronary CTA to definitively exclude heart disease. -Prescribe one-time dose of 100 mg Metoprolol Tartrate to be taken 2 hours before the scan. -Order Prednisone taper to be taken before the scan due to iodine allergy. -Order blood work [employer]) today. -Follow-up as needed based on testing results.  Diet and Lifestyle Obesity, BMI 41  Patient has made dietary modifications which have improved  symptoms. Noted high consumption of coffee creamer and patient has agreed to stop this. Patient has two full-time jobs and reports significant stress and lack of sleep. -Encourage continued dietary modifications. -Advise on importance of stress management and adequate sleep for overall health. -Encourage weight loss.  Hypertension Patient has a history of hypertension, currently managed with Metoprolol Succinate. -Continue Metoprolol Succinate as  prescribed.              Follow-up: Return if symptoms worsen or fail to improve.  Signed, Lenna Gilford. Flora Lipps, MD, [facility]  [facility]  [address] [place], [place] [address] [phone]  3:39 PM

## 2023-11-25 ENCOUNTER — Encounter: Payer: Self-pay | Admitting: Cardiovascular Disease

## 2023-11-25 ENCOUNTER — Ambulatory Visit: Payer: BC Managed Care – PPO | Attending: Cardiovascular Disease | Admitting: Cardiovascular Disease

## 2023-11-25 VITALS — BP 132/86 | HR 95 | Ht 75.0 in | Wt 332.0 lb

## 2023-11-25 DIAGNOSIS — R072 Precordial pain: Secondary | ICD-10-CM

## 2023-11-25 DIAGNOSIS — K219 Gastro-esophageal reflux disease without esophagitis: Secondary | ICD-10-CM

## 2023-11-25 DIAGNOSIS — R079 Chest pain, unspecified: Secondary | ICD-10-CM

## 2023-11-25 MED ORDER — METOPROLOL TARTRATE 100 MG PO TABS: 100.0000 mg | ORAL_TABLET | Freq: Once | ORAL | 0 refills | Status: AC

## 2023-11-25 MED ORDER — PREDNISONE 50 MG PO TABS: ORAL_TABLET | ORAL | 0 refills | Status: AC

## 2023-11-25 NOTE — Patient Instructions (Addendum)
Medication Instructions:  - Take Metoprolol Tartrate 100mg  2 hours before cardiac testing.  - Take Prednisone 50mg  13hours before test, then 7 hours before test and 1 hour before test.  - Take Benadryl 50mg  1 hour before  test.   *If you need a refill on your cardiac medications before your next appointment, please call your pharmacy*   Lab Work: BMET    If you have labs (blood work) drawn today and your tests are completely normal, you will receive your results only by: MyChart Message (if you have MyChart) OR A paper copy in the mail If you have any lab test that is abnormal or we need to change your treatment, we will call you to review the results.   Testing/Procedures:   Your cardiac CT will be scheduled at one of the below locations:   [facility] [address] [place], [place] [address] [phone]  OR   If scheduled at [facility], please arrive at the [facility] and Children's Entrance (Entrance C2) of [facility] 30 minutes prior to test start time. You can use the FREE valet parking offered at entrance C (encouraged to control the heart rate for the test)  Proceed to the [facility] Radiology Department (first floor) to check-in and test prep.  All radiology patients and guests should use entrance C2 at [facility], accessed from [facility], even though the hospital's physical address listed is [address].    Please follow these instructions carefully (unless otherwise directed):  An IV will be required for this test and Nitroglycerin will be given.  Hold all erectile dysfunction medications at least 3 days (72 hrs) prior to test. (Ie viagra, cialis, sildenafil, tadalafil, etc)   On the Night Before the Test: Be sure to Drink plenty of water. Do not consume any caffeinated/decaffeinated beverages or chocolate 12 hours prior to your test. Do not take any antihistamines 12 hours prior to your  test. If the patient has contrast allergy: Patient will need a prescription for Prednisone and very clear instructions (as follows): Prednisone 50 mg - take 13 hours prior to test Take another Prednisone 50 mg 7 hours prior to test Take another Prednisone 50 mg 1 hour prior to test Take Benadryl 50 mg 1 hour prior to test Patient must complete all four doses of above prophylactic medications. Patient will need a ride after test due to Benadryl.  On the Day of the Test: Drink plenty of water until 1 hour prior to the test. Do not eat any food 1 hour prior to test. You may take your regular medications prior to the test.  Take metoprolol (Lopressor) two hours prior to test. If you take Furosemide/Hydrochlorothiazide/Spironolactone/Chlorthalidone, please HOLD on the morning of the test. Patients who wear a continuous glucose monitor MUST remove the device prior to scanning. FEMALES- please wear underwire-free bra if available, avoid dresses & tight clothing      After the Test: Drink plenty of water. After receiving IV contrast, you may experience a mild flushed feeling. This is normal. On occasion, you may experience a mild rash up to 24 hours after the test. This is not dangerous. If this occurs, you can take Benadryl 25 mg and increase your fluid intake. If you experience trouble breathing, this can be serious. If it is severe call 911 IMMEDIATELY. If it is mild, please call our office.  We will call to schedule your test 2-4 weeks out understanding that  some insurance companies will need an authorization prior to the service being performed.   For more information and frequently asked questions, please visit our website : [email or web address]  For non-scheduling related questions, please contact the cardiac imaging nurse navigator should you have any questions/concerns: Cardiac Imaging Nurse Navigators Direct Office Dial: [phone]   For scheduling needs, including  cancellations and rescheduling, please call [place], [phone].    Follow-Up: At [facility], you and your health needs are our priority.  As part of our continuing mission to provide you with exceptional heart care, we have created designated Provider Care Teams.  These Care Teams include your primary Cardiologist (physician) and Advanced Practice Providers (APPs -  Physician Assistants and Nurse Practitioners) who all work together to provide you with the care you need, when you need it.  We recommend signing up for the patient portal called "MyChart".  Sign up information is provided on this After Visit Summary.  MyChart is used to connect with patients for Virtual Visits (Telemedicine).  Patients are able to view lab/test results, encounter notes, upcoming appointments, etc.  Non-urgent messages can be sent to your provider as well.   To learn more about what you can do with MyChart, go to [email or web address].    Your next appointment:   Follow up as needed pending results of cardiac testing.  Provider:   Lennie Odor, MD   Other Instructions

## 2023-11-26 LAB — BASIC METABOLIC PANEL
BUN/Creatinine Ratio: 15 (ref 9–20)
BUN: 15 mg/dL (ref 6–24)
CO2: 21 mmol/L (ref 20–29)
Calcium: 9.5 mg/dL (ref 8.7–10.2)
Chloride: 101 mmol/L (ref 96–106)
Creatinine, Ser: 0.99 mg/dL (ref 0.76–1.27)
Glucose: 86 mg/dL (ref 70–99)
Potassium: 4.2 mmol/L (ref 3.5–5.2)
Sodium: 136 mmol/L (ref 134–144)
eGFR: 93 mL/min/{1.73_m2} (ref >59)

## 2024-01-28 ENCOUNTER — Emergency Department (HOSPITAL_COMMUNITY)
Admission: EM | Admit: 2024-01-28 | Discharge: 2024-01-28 | Disposition: A | Discharge status: Home / Self Care | Payer: 59 | Attending: Emergency Medicine | Admitting: Emergency Medicine

## 2024-01-28 ENCOUNTER — Encounter (HOSPITAL_COMMUNITY): Payer: Self-pay | Admitting: Emergency Medicine

## 2024-01-28 ENCOUNTER — Other Ambulatory Visit: Payer: Self-pay

## 2024-01-28 ENCOUNTER — Emergency Department (HOSPITAL_COMMUNITY): Payer: 59

## 2024-01-28 DIAGNOSIS — J45909 Unspecified asthma, uncomplicated: Secondary | ICD-10-CM | POA: Insufficient documentation

## 2024-01-28 DIAGNOSIS — I10 Essential (primary) hypertension: Secondary | ICD-10-CM | POA: Insufficient documentation

## 2024-01-28 DIAGNOSIS — Z79899 Other long term (current) drug therapy: Secondary | ICD-10-CM | POA: Insufficient documentation

## 2024-01-28 DIAGNOSIS — R197 Diarrhea, unspecified: Secondary | ICD-10-CM | POA: Insufficient documentation

## 2024-01-28 DIAGNOSIS — K921 Melena: Secondary | ICD-10-CM | POA: Insufficient documentation

## 2024-01-28 DIAGNOSIS — Z7951 Long term (current) use of inhaled steroids: Secondary | ICD-10-CM | POA: Diagnosis not present

## 2024-01-28 DIAGNOSIS — R109 Unspecified abdominal pain: Secondary | ICD-10-CM | POA: Diagnosis present

## 2024-01-28 LAB — CBC WITH DIFFERENTIAL/PLATELET
Abs Immature Granulocytes: 0.02 10*3/uL (ref 0.00–0.07)
Basophils Absolute: 0 10*3/uL (ref 0.0–0.1)
Basophils Relative: 0 %
Eosinophils Absolute: 0.2 10*3/uL (ref 0.0–0.5)
Eosinophils Relative: 3 %
HCT: 45.3 % (ref 39.0–52.0)
Hemoglobin: 14.3 g/dL (ref 13.0–17.0)
Immature Granulocytes: 0 %
Lymphocytes Relative: 21 %
Lymphs Abs: 1.8 10*3/uL (ref 0.7–4.0)
MCH: 26.8 pg (ref 26.0–34.0)
MCHC: 31.6 g/dL (ref 30.0–36.0)
MCV: 84.8 fL (ref 80.0–100.0)
Monocytes Absolute: 0.6 10*3/uL (ref 0.1–1.0)
Monocytes Relative: 7 %
Neutro Abs: 5.6 10*3/uL (ref 1.7–7.7)
Neutrophils Relative %: 69 %
Platelets: 292 10*3/uL (ref 150–400)
RBC: 5.34 MIL/uL (ref 4.22–5.81)
RDW: 14.4 % (ref 11.5–15.5)
WBC: 8.2 10*3/uL (ref 4.0–10.5)
nRBC: 0 % (ref 0.0–0.2)

## 2024-01-28 LAB — TYPE AND SCREEN
ABO/RH(D): O POS
Antibody Screen: NEGATIVE

## 2024-01-28 LAB — COMPREHENSIVE METABOLIC PANEL
ALT: 25 U/L (ref 0–44)
AST: 23 U/L (ref 15–41)
Albumin: 3.5 g/dL (ref 3.5–5.0)
Alkaline Phosphatase: 81 U/L (ref 38–126)
Anion gap: 8 (ref 5–15)
BUN: 9 mg/dL (ref 6–20)
CO2: 24 mmol/L (ref 22–32)
Calcium: 8.8 mg/dL — ABNORMAL LOW (ref 8.9–10.3)
Chloride: 104 mmol/L (ref 98–111)
Creatinine, Ser: 1.04 mg/dL (ref 0.61–1.24)
GFR, Estimated: >60 mL/min (ref >60)
Glucose, Bld: 108 mg/dL — ABNORMAL HIGH (ref 70–99)
Potassium: 3.7 mmol/L (ref 3.5–5.1)
Sodium: 136 mmol/L (ref 135–145)
Total Bilirubin: 0.3 mg/dL (ref 0.0–1.2)
Total Protein: 7 g/dL (ref 6.5–8.1)

## 2024-01-28 LAB — PROTIME-INR
INR: 1 (ref 0.8–1.2)
Prothrombin Time: 13 s (ref 11.4–15.2)

## 2024-01-28 LAB — LIPASE, BLOOD: Lipase: 26 U/L (ref 11–51)

## 2024-01-28 MED ORDER — SODIUM CHLORIDE 0.9 % IV BOLUS
1000.0000 mL | Freq: Once | INTRAVENOUS | Status: AC
  Administered 2024-01-28: 1000 mL via INTRAVENOUS

## 2024-01-28 MED ORDER — ONDANSETRON HCL 4 MG/2ML IJ SOLN
4.0000 mg | Freq: Once | INTRAMUSCULAR | Status: AC
  Administered 2024-01-28: 4 mg via INTRAVENOUS
  Filled 2024-01-28: qty 2

## 2024-01-28 MED ORDER — ONDANSETRON 4 MG PO TBDP: 4.0000 mg | ORAL_TABLET | Freq: Three times a day (TID) | ORAL | 0 refills | Status: AC | PRN

## 2024-01-28 NOTE — ED Provider Notes (Addendum)
 [facility] EMERGENCY DEPARTMENT AT [facility] Provider Note   CSN: [id number] Arrival date & time: 01/28/24  1541     History  Chief Complaint  Patient presents with   Abdominal Pain    Calvin Clay is a 53 y.o. male.   Abdominal Pain 53 year old male history of hypertension, hyperlipidemia, GERD, asthma presenting for diarrhea, rectal bleeding.  Patient states he had some [place] food last night.  He woke up this morning and he noted 3 bowel movements where when he wiped he had some red blood on the toilet paper.  During the last episode he had diarrhea, no melena.  He had some blood/pink-tinged water in the toilet but no large volume of blood.  Has had some diffuse abdominal pain worse in the upper abdomen.  He has not had any vomiting but has had some nausea.  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  He had a colonoscopy at the same time that showed 3 polyps that were resected and moderate diverticulosis as well as internal hemorrhoids.  He used to be on Eliquis but has not been on this for some time.  He is not on any anticoagulation or antiplatelets.     Home Medications Prior to Admission medications   Medication Sig Start Date End Date Taking? Authorizing Provider  acetaminophen (TYLENOL) 650 MG CR tablet Take 975 mg by mouth every 8 (eight) hours as needed for pain or fever.    [provider]  albuterol (PROVENTIL HFA;VENTOLIN HFA) 108 (90 Base) MCG/ACT inhaler Inhale 2 puffs into the lungs every 6 (six) hours as needed for wheezing or shortness of breath. 07/27/17   Storm Frisk, MD  budesonide-formoterol [facility]) 160-4.5 MCG/ACT inhaler Inhale 2 puffs into the lungs 2 (two) times daily. 07/27/17   Storm Frisk, MD  LORazepam (ATIVAN) 1 MG tablet Take 0.5-1 mg by mouth 2 (two) times daily as needed. 01/11/21    [provider]  metoprolol succinate (TOPROL-XL) 25 MG 24 hr tablet Take 1 tablet (25 mg total) by mouth daily. 09/27/23   Lonell Grandchild, MD  metoprolol tartrate (LOPRESSOR) 100 MG tablet Take 1 tablet (100 mg total) by mouth once for 1 dose. Take 2 hrs prior to cardiac testing 11/25/23 11/25/23  Sande Rives, MD  Multiple Vitamin (MULTIVITAMIN) tablet Take 2 tablets by mouth daily.    [provider]  omeprazole (PRILOSEC) 20 MG capsule Take 1 capsule (20 mg total) by mouth daily. 12/21/22   Palumbo, April, MD  predniSONE (DELTASONE) 50 MG tablet Take Prednisone 50 mg 13 hours prior to test, then 7 hours prior to test, then 1 hour prior to test 11/25/23   Sande Rives, MD  rosuvastatin (CRESTOR) 40 MG tablet Take by mouth. 09/22/23   [provider]      Allergies    Iodine and Shrimp [shellfish allergy]    Review of Systems   Review of Systems  Gastrointestinal:  Positive for abdominal pain.  Review of systems completed and notable as per HPI.  ROS otherwise negative.   Physical Exam Updated Vital Signs BP (!) 150/121 (BP Location: Right Arm)   Pulse (!) 121   Temp 98.7 F (37.1 C)   Resp 20   Wt (!) 150 kg   SpO2 98%   BMI 41.33 kg/m  Physical Exam Vitals and nursing  note reviewed. Exam conducted with a chaperone present.  Constitutional:      General: He is not in acute distress.    Appearance: He is well-developed.  HENT:     Head: Normocephalic and atraumatic.  Eyes:     Conjunctiva/sclera: Conjunctivae normal.  Cardiovascular:     Rate and Rhythm: Normal rate and regular rhythm.     Pulses: Normal pulses.     Heart sounds: Normal heart sounds. No murmur heard. Pulmonary:     Effort: Pulmonary effort is normal. No respiratory distress.     Breath sounds: Normal breath sounds.  Abdominal:     General: There is no distension.     Palpations: Abdomen is soft.     Tenderness: There is no abdominal tenderness. There  is no right CVA tenderness, left CVA tenderness, guarding or rebound.  Genitourinary:    Comments: Rectal exam without hematochezia or melena.  No external hemorrhoids. Musculoskeletal:        General: No swelling.     Cervical back: Neck supple.     Right lower leg: No edema.     Left lower leg: No edema.  Skin:    General: Skin is warm and dry.     Capillary Refill: Capillary refill takes less than 2 seconds.  Neurological:     Mental Status: He is alert.  Psychiatric:        Mood and Affect: Mood normal.     ED Results / Procedures / Treatments   Labs (all labs ordered are listed, but only abnormal results are displayed) Labs Reviewed  COMPREHENSIVE METABOLIC PANEL - Abnormal; Notable for the following components:      Result Value   Glucose, Bld 108 (*)    Calcium 8.8 (*)    All other components within normal limits  CBC WITH DIFFERENTIAL/PLATELET  PROTIME-INR  LIPASE, BLOOD  TYPE AND SCREEN  ABO/RH    EKG None  Radiology CT ABDOMEN PELVIS WO CONTRAST Result Date: 01/28/2024 CLINICAL DATA:  Acute abdominal pain. Nephrolithiasis. Diverticulosis. EXAM: CT ABDOMEN AND PELVIS WITHOUT CONTRAST TECHNIQUE: Multidetector CT imaging of the abdomen and pelvis was performed following the standard protocol without IV contrast. RADIATION DOSE REDUCTION: This exam was performed according to the departmental dose-optimization program which includes automated exposure control, adjustment of the mA and/or kV according to patient size and/or use of iterative reconstruction technique. COMPARISON:  01/14/2020 FINDINGS: Lower chest: No acute findings. Hepatobiliary: No mass visualized on this unenhanced exam. Mild diffuse hepatic steatosis. Gallbladder is unremarkable. No evidence of biliary ductal dilatation. Pancreas: No mass or inflammatory process visualized on this unenhanced exam. Spleen:  Within normal limits in size. Adrenals/Urinary tract: A few punctate calculi are noted in the right  kidney. No evidence of urolithiasis or hydronephrosis. Unremarkable unopacified urinary bladder. Stomach/Bowel: No evidence of obstruction, inflammatory process, or abnormal fluid collections. Normal appendix visualized. Diffuse colonic diverticulosis is again seen, without signs of diverticulitis. Vascular/Lymphatic: No pathologically enlarged lymph nodes identified. No evidence of abdominal aortic aneurysm. Reproductive:  No mass or other significant abnormality. Other:  None. Musculoskeletal:  No suspicious bone lesions identified. IMPRESSION: Tiny right renal calculi. No evidence of urolithiasis, hydronephrosis, or other acute findings. Colonic diverticulosis, without radiographic evidence of diverticulitis. Mild hepatic steatosis. Electronically Signed   By: Danae Orleans M.D.   On: 01/28/2024 17:40    Procedures Procedures    Medications Ordered in ED Medications  sodium chloride 0.9 % bolus 1,000 mL (1,000 mLs Intravenous New Bag/Given 01/28/24  1916)  ondansetron (ZOFRAN) injection 4 mg (4 mg Intravenous Given 01/28/24 1915)    ED Course/ Medical Decision Making/ A&P                                 Medical Decision Making Risk Prescription drug management.   Medical Decision Making:   Calvin Clay is a 53 y.o. male who presented to the ED today with diarrhea, small mount of blood in his stool.  Vital signs reviewed.  He was noted to be tachycardic in triage although on my evaluation and on serial reevaluation his heart rate is normal.  He reports 3 episode of bowel today but had a small mount of blood especially when he wiped.  No melena.  His last episode had some diarrhea, his CT scan is reassuring.  No signs of diverticulitis, obstruction, appendicitis, cholecystitis.  I reviewed his prior EGD and colonoscopy, he has known internal hemorrhoids as well as some polyps.  I wonder if he may have some colonic irritation or hemorrhoid bleeding especially related to his diarrhea.  I suspect  his pain is related to gastroenteritis or colitis.  He has right nonobstructing renal calculi which I do not think are contributing to his symptoms.  On rectal exam he does not have any evidence of bleeding, and he said no bloody bowel movements during the nearly 5 hours of observation here.  His hemoglobin is normal, electrolytes are unremarkable as well.  Given this, I do not think he needs hospitalization at this time.  He is normotensive, nontachycardic, with no evidence of ongoing bleeding.  I did recommend he follow up closely with his GI doctor and call them tomorrow to schedule close follow-up.  I also given strict return precautions for any recurrent bleeding, worsening pain.  I do not see any indication for antibiotics at this time.  Patient is comfortable with this plan.   Patient placed on continuous vitals and telemetry monitoring while in ED which was reviewed periodically.  Reviewed and confirmed nursing documentation for past medical history, family history, social history.   Patient's presentation is most consistent with acute complicated illness / injury requiring diagnostic workup.           Final Clinical Impression(s) / ED Diagnoses Final diagnoses:  Diarrhea, unspecified type  Blood in stool    Rx / DC Orders ED Discharge Orders     None         Laurence Spates, MD 01/28/24 2031    Laurence Spates, MD 01/28/24 2032

## 2024-01-28 NOTE — ED Provider Triage Note (Signed)
 Emergency Medicine Provider Triage Evaluation Note  Calvin Clay , a 53 y.o. male  was evaluated in triage.  Pt complains of hematochezia.  Review of Systems  Positive:  Negative:   Physical Exam  BP (!) 150/121 (BP Location: Right Arm)   Pulse (!) 121   Temp 98.7 F (37.1 C)   Resp 20   Wt (!) 150 kg   SpO2 98%   BMI 41.33 kg/m  Gen:   Awake, no distress   Resp:  Normal effort  MSK:   Moves extremities without difficulty  Other:    Medical Decision Making  Medically screening exam initiated at 3:49 PM.  Appropriate orders placed.  Calvin Clay was informed that the remainder of the evaluation will be completed by another provider, this initial triage assessment does not replace that evaluation, and the importance of remaining in the ED until their evaluation is complete.  Last colonoscopy in 2022 showing polyps. Patient with diarrhea with dark and bright red blood mixed in since this morning. Also with generalized abdominal pain. Not on blood thinners. No recent constipation.   Dorthy Cooler, [place] 01/28/24 [phone]

## 2024-01-28 NOTE — Discharge Instructions (Signed)
 Your CT scan and lab work today were reassuring.  I suspect your diarrhea is likely related to a possible GI bug.  I think the bleeding is likely due to irritation from this or internal hemorrhoids which you have.  You should call your GI doctor and primary care doctor on Monday to schedule close follow-up.  If you develop worsening bleeding, dizziness, fainting, chest pain, shortness of breath, fever, or severe abdominal pain you should return to the ED.  You can take the prescribed nausea medication to help with nausea.

## 2024-01-28 NOTE — ED Triage Notes (Signed)
 Presents from home for HA, blood in stool (diarrhea with blood mixed in), upper abd fullness starting today. H/o Mallory Janann August, polyps on last colonoscopy 2 yrs ago Not currently on a blood thinner

## 2024-03-02 NOTE — Progress Notes (Deleted)
 03/02/2024 Calvin Clay [id number] 12/09/1970  Referring provider: Lindaann Pascal, PA-C Primary GI doctor: {acdocs:[id number]} Russella Dar)  ASSESSMENT AND PLAN:   Assessment and Plan          Rectal bleeding Personal history of colon polyps, family history of colon cancer in his father, due for recall colonoscopy 03/2024 01/28/2024 Hgb 14.3 no anemia 01/28/2024 CT abdomen pelvis without contrast diverticulosis no diverticulitis, tiny right renal calculi mild hepatic steatosis.  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 pylori  Father with history of colon cancer/personal history of colon polyps 2022 colonoscopy good prep 2 TA polyps 5 to 8 mm, one 3 mm TA polyp, moderate diverticulosis, internal hemorrhoids Recall 03/2024  Hepatic steatosis seen on ultrasound 2022 CT 01/2024 mild hepatic steatosis normal spleen. 01/28/2024 normal liver function, normal platelets  History of PE 2021 during COVID infection Off of anticoagulation Patient Care Team: Lindaann Pascal, PA-C as PCP - General (Physician Assistant)  HISTORY OF PRESENT ILLNESS: 53 y.o. male with a past medical history listed below presents for ER follow-up for rectal bleeding.  Patient previously known to Dr. Russella Dar last office visit 2022.  Discussed the use of AI scribe software for clinical note transcription with the patient, who gave verbal consent to proceed.  History of Present Illness            He  reports that he has never smoked. He has never used smokeless tobacco. He reports current alcohol use. He reports that he does not use drugs.  RELEVANT GI HISTORY, IMAGING AND LABS: Results          CBC    Component Value Date/Time   WBC 8.2 01/28/2024 1552   RBC 5.34 01/28/2024 1552   HGB 14.3 01/28/2024 1552   HCT 45.3 01/28/2024 1552   PLT 292 01/28/2024 1552   MCV 84.8  01/28/2024 1552   MCH 26.8 01/28/2024 1552   MCHC 31.6 01/28/2024 1552   RDW 14.4 01/28/2024 1552   LYMPHSABS 1.8 01/28/2024 1552   MONOABS 0.6 01/28/2024 1552   EOSABS 0.2 01/28/2024 1552   BASOSABS 0.0 01/28/2024 1552   Recent Labs    09/27/23 0843 01/28/24 1552  HGB 14.9 14.3    CMP     Component Value Date/Time   NA 136 01/28/2024 1552   NA 136 11/25/2023 1617   K 3.7 01/28/2024 1552   CL 104 01/28/2024 1552   CO2 24 01/28/2024 1552   GLUCOSE 108 (H) 01/28/2024 1552   BUN 9 01/28/2024 1552   BUN 15 11/25/2023 1617   CREATININE 1.04 01/28/2024 1552   CALCIUM 8.8 (L) 01/28/2024 1552   PROT 7.0 01/28/2024 1552   ALBUMIN 3.5 01/28/2024 1552   AST 23 01/28/2024 1552   ALT 25 01/28/2024 1552   ALKPHOS 81 01/28/2024 1552   BILITOT 0.3 01/28/2024 1552   GFRNONAA >60 01/28/2024 1552   GFRAA >60 01/21/2020 1102      Latest Ref Rng & Units 01/28/2024    3:52 PM 01/17/2020   10:11 AM 01/14/2020    3:34 PM  Hepatic Function  Total Protein 6.5 - 8.1 g/dL 7.0  7.8  7.2   Albumin 3.5 - 5.0 g/dL 3.5  3.6  3.5   AST 15 - 41 U/L 23  38  35   ALT 0 - 44 U/L 25  34  44   Alk Phosphatase 38 - 126  U/L 81  69  72   Total Bilirubin 0.0 - 1.2 mg/dL 0.3  1.8  0.4   Bilirubin, Direct 0.0 - 0.2 mg/dL  0.5        Current Medications:   Current Outpatient Medications (Endocrine & Metabolic):    predniSONE (DELTASONE) 50 MG tablet, Take Prednisone 50 mg 13 hours prior to test, then 7 hours prior to test, then 1 hour prior to test  Current Outpatient Medications (Cardiovascular):    metoprolol succinate (TOPROL-XL) 25 MG 24 hr tablet, Take 1 tablet (25 mg total) by mouth daily.   metoprolol tartrate (LOPRESSOR) 100 MG tablet, Take 1 tablet (100 mg total) by mouth once for 1 dose. Take 2 hrs prior to cardiac testing   rosuvastatin (CRESTOR) 40 MG tablet, Take by mouth.  Current Outpatient Medications (Respiratory):    albuterol (PROVENTIL HFA;VENTOLIN HFA) 108 (90 Base) MCG/ACT  inhaler, Inhale 2 puffs into the lungs every 6 (six) hours as needed for wheezing or shortness of breath.   budesonide-formoterol (SYMBICORT) 160-4.5 MCG/ACT inhaler, Inhale 2 puffs into the lungs 2 (two) times daily.  Current Outpatient Medications (Analgesics):    acetaminophen (TYLENOL) 650 MG CR tablet, Take 975 mg by mouth every 8 (eight) hours as needed for pain or fever.   Current Outpatient Medications (Other):    LORazepam (ATIVAN) 1 MG tablet, Take 0.5-1 mg by mouth 2 (two) times daily as needed.   Multiple Vitamin (MULTIVITAMIN) tablet, Take 2 tablets by mouth daily.   omeprazole (PRILOSEC) 20 MG capsule, Take 1 capsule (20 mg total) by mouth daily.   ondansetron (ZOFRAN-ODT) 4 MG disintegrating tablet, Take 1 tablet (4 mg total) by mouth every 8 (eight) hours as needed for nausea or vomiting.  Medical History:  Past Medical History:  Diagnosis Date   Allergy    Anxiety    Asthma    COVID-19    GERD (gastroesophageal reflux disease)    Hyperlipidemia    Hypertension    only took medications when he had Covid-19   Migraine    Allergies:  Allergies  Allergen Reactions   Iodine Anaphylaxis and Itching   Shrimp [Shellfish Allergy] Swelling    Throat closing     Surgical History:  He  has a past surgical history that includes Cosmetic surgery; Esophagogastroduodenoscopy (09/16/2012); Colonoscopy; and Tendon repair (Right). Family History:  His family history includes Asthma in his brother, mother, and sister; Colon cancer in his father; Diverticulitis in his mother; Multiple sclerosis in his father; Ovarian cancer in his mother.  REVIEW OF SYSTEMS  : All other systems reviewed and negative except where noted in the History of Present Illness.  PHYSICAL EXAM: There were no vitals taken for this visit. Physical Exam          Doree Albee, PA-C 9:29 AM

## 2024-03-05 ENCOUNTER — Ambulatory Visit: Payer: 59 | Admitting: Physician Assistant

## 2024-06-06 ENCOUNTER — Ambulatory Visit: Payer: Self-pay | Admitting: *Deleted
